# Patient Record
Sex: Male | Born: 1975 | Race: White | Hispanic: No | Marital: Married | Smoking: Former smoker
Health system: Southern US, Community
[De-identification: ages and names within clinical notes are randomized; demographics above are authoritative.]

## PROBLEM LIST (undated history)

## (undated) DIAGNOSIS — Q059 Spina bifida, unspecified: Secondary | ICD-10-CM

## (undated) DIAGNOSIS — N289 Disorder of kidney and ureter, unspecified: Secondary | ICD-10-CM

## (undated) DIAGNOSIS — J45909 Unspecified asthma, uncomplicated: Secondary | ICD-10-CM

## (undated) DIAGNOSIS — N319 Neuromuscular dysfunction of bladder, unspecified: Secondary | ICD-10-CM

## (undated) DIAGNOSIS — G919 Hydrocephalus, unspecified: Secondary | ICD-10-CM

## (undated) DIAGNOSIS — R569 Unspecified convulsions: Secondary | ICD-10-CM

## (undated) HISTORY — PX: CHOLECYSTECTOMY: SHX55

## (undated) HISTORY — PX: VENTRICULOPERITONEAL SHUNT: SHX204

---

## 2012-06-03 DIAGNOSIS — G609 Hereditary and idiopathic neuropathy, unspecified: Secondary | ICD-10-CM | POA: Insufficient documentation

## 2012-06-03 DIAGNOSIS — Q054 Unspecified spina bifida with hydrocephalus: Secondary | ICD-10-CM | POA: Insufficient documentation

## 2012-08-19 DIAGNOSIS — G43909 Migraine, unspecified, not intractable, without status migrainosus: Secondary | ICD-10-CM | POA: Insufficient documentation

## 2014-03-11 DIAGNOSIS — G8929 Other chronic pain: Secondary | ICD-10-CM | POA: Insufficient documentation

## 2014-10-07 DIAGNOSIS — F17200 Nicotine dependence, unspecified, uncomplicated: Secondary | ICD-10-CM | POA: Insufficient documentation

## 2014-10-18 DIAGNOSIS — R55 Syncope and collapse: Secondary | ICD-10-CM | POA: Insufficient documentation

## 2015-06-09 DIAGNOSIS — Z982 Presence of cerebrospinal fluid drainage device: Secondary | ICD-10-CM | POA: Insufficient documentation

## 2015-12-15 DIAGNOSIS — N3946 Mixed incontinence: Secondary | ICD-10-CM | POA: Insufficient documentation

## 2016-01-25 DIAGNOSIS — G4733 Obstructive sleep apnea (adult) (pediatric): Secondary | ICD-10-CM | POA: Insufficient documentation

## 2016-01-25 DIAGNOSIS — E669 Obesity, unspecified: Secondary | ICD-10-CM | POA: Insufficient documentation

## 2016-07-24 DIAGNOSIS — N200 Calculus of kidney: Secondary | ICD-10-CM | POA: Insufficient documentation

## 2016-10-22 DIAGNOSIS — K859 Acute pancreatitis without necrosis or infection, unspecified: Secondary | ICD-10-CM | POA: Insufficient documentation

## 2016-10-22 DIAGNOSIS — R112 Nausea with vomiting, unspecified: Secondary | ICD-10-CM | POA: Diagnosis present

## 2016-10-22 LAB — COMPREHENSIVE METABOLIC PANEL
ALT: 32 U/L (ref 17–63)
AST: 26 U/L (ref 15–41)
Albumin: 4.6 g/dL (ref 3.5–5.0)
Alkaline Phosphatase: 91 U/L (ref 38–126)
Anion gap: 7 (ref 5–15)
BUN: 19 mg/dL (ref 6–20)
CALCIUM: 9.2 mg/dL (ref 8.9–10.3)
CO2: 21 mmol/L — AB (ref 22–32)
Chloride: 109 mmol/L (ref 101–111)
Creatinine, Ser: 1.03 mg/dL (ref 0.61–1.24)
GFR calc Af Amer: >60 mL/min (ref >60)
GLUCOSE: 110 mg/dL — AB (ref 65–99)
Potassium: 3.5 mmol/L (ref 3.5–5.1)
Sodium: 137 mmol/L (ref 135–145)
Total Bilirubin: 0.5 mg/dL (ref 0.3–1.2)
Total Protein: 8.2 g/dL — ABNORMAL HIGH (ref 6.5–8.1)

## 2016-10-22 LAB — CBC
HCT: 47 % (ref 40.0–52.0)
HEMOGLOBIN: 16.5 g/dL (ref 13.0–18.0)
MCH: 28.9 pg (ref 26.0–34.0)
MCHC: 35.2 g/dL (ref 32.0–36.0)
MCV: 82 fL (ref 80.0–100.0)
Platelets: 261 10*3/uL (ref 150–440)
RBC: 5.72 MIL/uL (ref 4.40–5.90)
RDW: 14 % (ref 11.5–14.5)
WBC: 12.1 10*3/uL — ABNORMAL HIGH (ref 3.8–10.6)

## 2016-10-22 LAB — LIPASE, BLOOD: LIPASE: 52 U/L — AB (ref 11–51)

## 2016-10-22 NOTE — ED Triage Notes (Signed)
Patient reports symptoms began this morning.  Reports nausea, vomiting and diarrhea.

## 2016-10-23 ENCOUNTER — Encounter: Payer: Self-pay | Admitting: Emergency Medicine

## 2016-10-23 ENCOUNTER — Emergency Department
Admission: EM | Admit: 2016-10-23 | Discharge: 2016-10-23 | Disposition: A | Discharge status: Home / Self Care | Payer: Medicaid Other | Attending: Emergency Medicine | Admitting: Emergency Medicine

## 2016-10-23 DIAGNOSIS — R112 Nausea with vomiting, unspecified: Secondary | ICD-10-CM

## 2016-10-23 DIAGNOSIS — K859 Acute pancreatitis without necrosis or infection, unspecified: Secondary | ICD-10-CM

## 2016-10-23 DIAGNOSIS — R197 Diarrhea, unspecified: Secondary | ICD-10-CM

## 2016-10-23 HISTORY — DX: Hydrocephalus, unspecified: G91.9

## 2016-10-23 HISTORY — DX: Unspecified convulsions: R56.9

## 2016-10-23 HISTORY — DX: Disorder of kidney and ureter, unspecified: N28.9

## 2016-10-23 HISTORY — DX: Neuromuscular dysfunction of bladder, unspecified: N31.9

## 2016-10-23 HISTORY — DX: Spina bifida, unspecified: Q05.9

## 2016-10-23 LAB — URINALYSIS, COMPLETE (UACMP) WITH MICROSCOPIC
BILIRUBIN URINE: NEGATIVE
Bacteria, UA: NONE SEEN
Glucose, UA: NEGATIVE mg/dL
HGB URINE DIPSTICK: NEGATIVE
KETONES UR: NEGATIVE mg/dL
NITRITE: NEGATIVE
Protein, ur: NEGATIVE mg/dL
Specific Gravity, Urine: 1.027 (ref 1.005–1.030)
pH: 5 (ref 5.0–8.0)

## 2016-10-23 MED ORDER — ONDANSETRON 4 MG PO TBDP: 4.0000 mg | ORAL_TABLET | Freq: Three times a day (TID) | ORAL | 0 refills | Status: DC | PRN

## 2016-10-23 MED ORDER — SODIUM CHLORIDE 0.9 % IV BOLUS (SEPSIS)
1000.0000 mL | Freq: Once | INTRAVENOUS | Status: AC
  Administered 2016-10-23: 1000 mL via INTRAVENOUS

## 2016-10-23 NOTE — ED Notes (Signed)
Pt. States he started vomiting when he woke up yesterday.  Pt. States vomiting 3 more times in a.m.  Pt. States diarrhea started in afternoon with multiple episodes.

## 2016-10-23 NOTE — ED Provider Notes (Signed)
[facility] Emergency Department Provider Note   ____________________________________________   First MD Initiated Contact with Patient 10/23/16 0102     (approximate)  I have reviewed the triage vital signs and the nursing notes.   HISTORY  Chief Complaint Emesis and Diarrhea    HPI Timothy Mullins is a 41 y.o. male who comes into the hospital today and thinking he has the flu. The patient has been having vomiting and diarrhea. He has not had any fever and he has some squeezing pain in his abdomen. The patient's symptoms started this morning. He reports that he vomited 3 times and it was dark-looking and brown. He's had 8 episodes of diarrhea since. He has been drinking some water. He rates abdominal discomfort about a 2 out of 10 in intensity. The patient denies any sick contacts. He has no chest pain or shortness of breath or body aches. He's had some chills with some nausea and some headache. The patient is here today for evaluation.   Past Medical History:  Diagnosis Date  . Hydrocephalus   . Neurogenic bladder   . Renal disorder   . Seizures (HCC)   . Spina bifida (HCC)     There are no active problems to display for this patient.   Past Surgical History:  Procedure Laterality Date  . CHOLECYSTECTOMY    . VENTRICULOPERITONEAL SHUNT      Prior to Admission medications   Medication Sig Start Date End Date Taking? Authorizing Provider  ondansetron (ZOFRAN ODT) 4 MG disintegrating tablet Take 1 tablet (4 mg total) by mouth every 8 (eight) hours as needed for nausea or vomiting. 10/23/16   Rebecka Apley, MD    Allergies Ativan [lorazepam]; Benadryl [diphenhydramine hcl (sleep)]; Iodine; and Latex  No family history on file.  Social History Social History  Substance Use Topics  . Smoking status: Never Smoker  . Smokeless tobacco: Not on file  . Alcohol use No    Review of Systems Constitutional: No fever/chills Eyes: No visual  changes. ENT: No sore throat. Cardiovascular: Denies chest pain. Respiratory: Denies shortness of breath. Gastrointestinal: abdominal pain.   nausea,  vomiting. diarrhea.  No constipation. Genitourinary: Negative for dysuria. Musculoskeletal: Negative for back pain. Skin: Negative for rash. Neurological: headache  10-point ROS otherwise negative.  ____________________________________________   PHYSICAL EXAM:  VITAL SIGNS: ED Triage Vitals  Enc Vitals Group     BP 10/22/16 2143 (!) 130/91     Pulse Rate 10/22/16 2143 (!) 102     Resp 10/22/16 2143 18     Temp 10/22/16 2143 98.5 F (36.9 C)     Temp Source 10/22/16 2143 Oral     SpO2 10/22/16 2143 97 %     Weight 10/22/16 2142 182 lb (82.6 kg)     Height 10/22/16 2142 5\' 2"  (1.575 m)     Head Circumference --      Peak Flow --      Pain Score 10/22/16 2142 2     Pain Loc --      Pain Edu? --      Excl. in GC? --     Constitutional: Alert and oriented. Well appearing and in mild distress. Eyes: Conjunctivae are normal. PERRL. EOMI. Head: Atraumatic. Nose: No congestion/rhinnorhea. Mouth/Throat: Mucous membranes are moist.  Oropharynx non-erythematous. Cardiovascular: Normal rate, regular rhythm. Grossly normal heart sounds.  Good peripheral circulation. Respiratory: Normal respiratory effort.  No retractions. Lungs CTAB. Gastrointestinal: Soft with some epigastric tenderness to  palpation. No distention. Positive bowel sounds Musculoskeletal: No lower extremity tenderness nor edema.   Neurologic:  Normal speech and language.  Skin:  Skin is warm, dry and intact.  Psychiatric: Mood and affect are normal.   ____________________________________________   LABS (all labs ordered are listed, but only abnormal results are displayed)  Labs Reviewed  LIPASE, BLOOD - Abnormal; Notable for the following:       Result Value   Lipase 52 (*)    All other components within normal limits  COMPREHENSIVE METABOLIC PANEL -  Abnormal; Notable for the following:    CO2 21 (*)    Glucose, Bld 110 (*)    Total Protein 8.2 (*)    All other components within normal limits  CBC - Abnormal; Notable for the following:    WBC 12.1 (*)    All other components within normal limits  URINALYSIS, COMPLETE (UACMP) WITH MICROSCOPIC - Abnormal; Notable for the following:    Color, Urine YELLOW (*)    APPearance CLEAR (*)    Leukocytes, UA TRACE (*)    Squamous Epithelial / LPF 0-5 (*)    All other components within normal limits   ____________________________________________  EKG  none ____________________________________________  RADIOLOGY  none ____________________________________________   PROCEDURES  Procedure(s) performed: None  Procedures  Critical Care performed: No  ____________________________________________   INITIAL IMPRESSION / ASSESSMENT AND PLAN / ED COURSE  Pertinent labs & imaging results that were available during my care of the patient were reviewed by me and considered in my medical decision making (see chart for details).  This is a 41 year old male who comes into the hospital today with some nausea and vomiting and diarrhea. The patient does have some abdominal cramping but otherwise no other complaints. He thought he had flu but is not having any fevers body aches. The patient does appear to have some mild pancreatitis with elevated lipase. I did give him a liter of normal saline and he was feeling improved. He was able to drink and eat crackers without any vomiting. I will discharge the patient to home. He likely has some gastroenteritis with some acute pancreatitis. The patient should follow-up with his primary care physician.      ____________________________________________   FINAL CLINICAL IMPRESSION(S) / ED DIAGNOSES  Final diagnoses:  Acute pancreatitis, unspecified complication status, unspecified pancreatitis type  Nausea vomiting and diarrhea      NEW  MEDICATIONS STARTED DURING THIS VISIT:  New Prescriptions   ONDANSETRON (ZOFRAN ODT) 4 MG DISINTEGRATING TABLET    Take 1 tablet (4 mg total) by mouth every 8 (eight) hours as needed for nausea or vomiting.     Note:  This document was prepared using Dragon voice recognition software and may include unintentional dictation errors.    Rebecka ApleyAllison P Webster, MD 10/23/16 [phone]

## 2016-10-23 NOTE — ED Notes (Signed)
Pt. Going home with family. 

## 2017-02-27 ENCOUNTER — Encounter: Payer: Self-pay | Admitting: Emergency Medicine

## 2017-02-27 DIAGNOSIS — Z5321 Procedure and treatment not carried out due to patient leaving prior to being seen by health care provider: Secondary | ICD-10-CM | POA: Insufficient documentation

## 2017-02-27 DIAGNOSIS — M79601 Pain in right arm: Secondary | ICD-10-CM | POA: Diagnosis present

## 2017-02-27 NOTE — ED Triage Notes (Addendum)
Pt to triage via w/c with no distress noted, brought in by EMS; reports pain to right upper arm, sudden onset while watching TV hr PTA; st pain increases with arm raise; pt denies any accomp symptoms; denies any weakness or numbness

## 2017-02-28 ENCOUNTER — Emergency Department
Admission: EM | Admit: 2017-02-28 | Discharge: 2017-02-28 | Disposition: A | Discharge status: Home / Self Care | Payer: Medicaid Other | Attending: Emergency Medicine | Admitting: Emergency Medicine

## 2017-07-05 DIAGNOSIS — N10 Acute tubulo-interstitial nephritis: Secondary | ICD-10-CM | POA: Insufficient documentation

## 2017-11-03 ENCOUNTER — Emergency Department
Admission: EM | Admit: 2017-11-03 | Discharge: 2017-11-03 | Disposition: A | Discharge status: Home / Self Care | Payer: Non-veteran care | Attending: Emergency Medicine | Admitting: Emergency Medicine

## 2017-11-03 ENCOUNTER — Other Ambulatory Visit: Payer: Self-pay

## 2017-11-03 ENCOUNTER — Encounter: Payer: Self-pay | Admitting: Emergency Medicine

## 2017-11-03 ENCOUNTER — Emergency Department: Payer: Non-veteran care

## 2017-11-03 DIAGNOSIS — R0981 Nasal congestion: Secondary | ICD-10-CM | POA: Diagnosis present

## 2017-11-03 DIAGNOSIS — R319 Hematuria, unspecified: Secondary | ICD-10-CM | POA: Insufficient documentation

## 2017-11-03 DIAGNOSIS — J069 Acute upper respiratory infection, unspecified: Secondary | ICD-10-CM | POA: Insufficient documentation

## 2017-11-03 DIAGNOSIS — Z79899 Other long term (current) drug therapy: Secondary | ICD-10-CM | POA: Insufficient documentation

## 2017-11-03 DIAGNOSIS — Z91040 Latex allergy status: Secondary | ICD-10-CM | POA: Insufficient documentation

## 2017-11-03 LAB — URINALYSIS, COMPLETE (UACMP) WITH MICROSCOPIC
BACTERIA UA: NONE SEEN
Bilirubin Urine: NEGATIVE
GLUCOSE, UA: NEGATIVE mg/dL
KETONES UR: NEGATIVE mg/dL
Leukocytes, UA: NEGATIVE
NITRITE: NEGATIVE
PROTEIN: NEGATIVE mg/dL
Specific Gravity, Urine: 1.015 (ref 1.005–1.030)
pH: 7 (ref 5.0–8.0)

## 2017-11-03 LAB — INFLUENZA PANEL BY PCR (TYPE A & B)
Influenza A By PCR: NEGATIVE
Influenza B By PCR: NEGATIVE

## 2017-11-03 MED ORDER — FLUTICASONE PROPIONATE 50 MCG/ACT NA SUSP: 2.0000 | Freq: Every day | NASAL | 0 refills | Status: DC

## 2017-11-03 MED ORDER — PSEUDOEPH-BROMPHEN-DM 30-2-10 MG/5ML PO SYRP: 2.5000 mL | ORAL_SOLUTION | Freq: Four times a day (QID) | ORAL | 0 refills | Status: DC | PRN

## 2017-11-03 NOTE — ED Notes (Signed)
Pt c/o facial pain since this morning; mostly congestion and a H/A since yesterday.

## 2017-11-03 NOTE — ED Triage Notes (Signed)
Sinus congestion and drainage x 3 days. States drainage is green.

## 2017-11-03 NOTE — ED Provider Notes (Signed)
[facility] Emergency Department Provider Note  ____________________________________________  Time seen: Approximately 3:51 PM  I have reviewed the triage vital signs and the nursing notes.   HISTORY  Chief Complaint Facial Pain   HPI Timothy Mullins is a 42 y.o. male that presents to the emergency department for evaluation of nasal congestion and productive cough with yellow sputum for 2 days. He states that he has also had some central lower abdominal pain and has also had a couple of episodes of diarrhea since Wednesday.  Patient went to urgent care yesterday and was told that he has a viral sinus infection and was told to purchase OTC cough and cold medication.  He states that he does not believe this and thinks he has influenza.  Wife also has cold symptoms.   He has a neurogenic bladder and self catheterizes.  He has had several urinary tract infections in the past and is concerned that he may have another.  He has an appointment with his urologist in 2 weeks.  No fever, chills, back pain, flank pain, hematuria.  Past Medical History:  Diagnosis Date  . Hydrocephalus   . Neurogenic bladder   . Renal disorder   . Seizures (HCC)   . Spina bifida (HCC)     There are no active problems to display for this patient.   Past Surgical History:  Procedure Laterality Date  . CHOLECYSTECTOMY    . VENTRICULOPERITONEAL SHUNT      Prior to Admission medications   Medication Sig Start Date End Date Taking? Authorizing Provider  brompheniramine-pseudoephedrine-DM 30-2-10 MG/5ML syrup Take 2.5 mLs by mouth 4 (four) times daily as needed. 11/03/17   Enid Derry, PA-C  fluticasone (FLONASE) 50 MCG/ACT nasal spray Place 2 sprays into both nostrils daily. 11/03/17 11/03/18  Enid Derry, PA-C  ondansetron (ZOFRAN ODT) 4 MG disintegrating tablet Take 1 tablet (4 mg total) by mouth every 8 (eight) hours as needed for nausea or vomiting. 10/23/16   Rebecka Apley, MD     Allergies Ativan [lorazepam]; Benadryl [diphenhydramine hcl (sleep)]; Iodine; and Latex  No family history on file.  Social History Social History   Tobacco Use  . Smoking status: Never Smoker  . Smokeless tobacco: Never Used  Substance Use Topics  . Alcohol use: No  . Drug use: Not on file     Review of Systems  Constitutional: No fever/chills Eyes: No visual changes. No discharge. ENT: Positive for congestion and rhinorrhea. Cardiovascular: No chest pain. Respiratory: Positive for cough. No SOB. Gastrointestinal: No vomiting.  No constipation. Musculoskeletal: Negative for musculoskeletal pain. Skin: Negative for rash, abrasions, lacerations, ecchymosis. Neurological: Negative for headaches.   ____________________________________________   PHYSICAL EXAM:  VITAL SIGNS: ED Triage Vitals [11/03/17 1431]  Enc Vitals Group     BP 130/89     Pulse Rate 93     Resp 20     Temp 98.7 F (37.1 C)     Temp Source Oral     SpO2 97 %     Weight 174 lb (78.9 kg)     Height 5\' 2"  (1.575 m)     Head Circumference      Peak Flow      Pain Score 8     Pain Loc      Pain Edu?      Excl. in GC?      Constitutional: Alert and oriented. Well appearing and in no acute distress. Eyes: Conjunctivae are normal. PERRL. EOMI. No  discharge. Head: Atraumatic. ENT: Positive maxillary sinus tenderness.      Ears: Tympanic membranes pearly gray with good landmarks. No discharge.      Nose: Mild congestion/rhinnorhea.      Mouth/Throat: Mucous membranes are moist. Oropharynx non-erythematous. Tonsils not enlarged. No exudates. Uvula midline. Neck: No stridor.   Hematological/Lymphatic/Immunilogical: No cervical lymphadenopathy. Cardiovascular: Normal rate, regular rhythm.  Good peripheral circulation. Respiratory: Normal respiratory effort without tachypnea or retractions. Lungs CTAB. Good air entry to the bases with no decreased or absent breath sounds. Gastrointestinal:  Bowel sounds 4 quadrants.  Minimal suprapubic tenderness. No guarding or rigidity. No palpable masses. No distention. Musculoskeletal: Full range of motion to all extremities. No gross deformities appreciated. Neurologic:  Normal speech and language. No gross focal neurologic deficits are appreciated.  Skin:  Skin is warm, dry and intact. No rash noted.   ____________________________________________   LABS (all labs ordered are listed, but only abnormal results are displayed)  Labs Reviewed  URINALYSIS, COMPLETE (UACMP) WITH MICROSCOPIC - Abnormal; Notable for the following components:      Result Value   Color, Urine YELLOW (*)    APPearance CLOUDY (*)    Hgb urine dipstick SMALL (*)    Squamous Epithelial / LPF 0-5 (*)    All other components within normal limits  INFLUENZA PANEL BY PCR (TYPE A & B)   ____________________________________________  EKG   ____________________________________________  RADIOLOGY Lexine BatonI, Alisea Matte, personally viewed and evaluated these images (plain radiographs) as part of my medical decision making, as well as reviewing the written report by the radiologist.  Dg Chest 2 View  Result Date: 11/03/2017 CLINICAL DATA:  Cough and shortness of breath for 3 days. EXAM: CHEST  2 VIEW COMPARISON:  None. FINDINGS: The cardiomediastinal silhouette is unremarkable. VP shunt catheters overlying the chest noted. There is no evidence of focal airspace disease, pulmonary edema, suspicious pulmonary nodule/mass, pleural effusion, or pneumothorax. No acute bony abnormalities are identified. IMPRESSION: No active cardiopulmonary disease. Electronically Signed   By: Harmon PierJeffrey  Hu M.D.   On: 11/03/2017 16:35   Ct Renal Stone Study  Result Date: 11/03/2017 CLINICAL DATA:  Hematuria EXAM: CT ABDOMEN AND PELVIS WITHOUT CONTRAST TECHNIQUE: Multidetector CT imaging of the abdomen and pelvis was performed following the standard protocol without IV contrast. COMPARISON:  None.  FINDINGS: Lower chest: Lung bases demonstrate no acute consolidation or pleural effusion. Normal heart size. Hepatobiliary: No focal liver abnormality is seen. Status post cholecystectomy. No biliary dilatation. Pancreas: Unremarkable. No pancreatic ductal dilatation or surrounding inflammatory changes. Spleen: Normal in size without focal abnormality. Adrenals/Urinary Tract: Adrenal glands are within normal limits. No hydronephrosis. Stone in the midpole of the left kidney and the lower pole of the left kidney. Stone in the midpole measures up to 7 mm on coronal views. No ureteral stone. The bladder is unremarkable Stomach/Bowel: Stomach is within normal limits. Appendix appears normal. No evidence of bowel wall thickening, distention, or inflammatory changes. Vascular/Lymphatic: No significant vascular findings are present. No enlarged abdominal or pelvic lymph nodes. Reproductive: Coarse calcifications in the prostate. Other: Negative for free air or free fluid. Shunt catheters within the anterior abdomen, no associated fluid collections. Small fat in the umbilical region. Musculoskeletal: No acute or significant osseous findings. IMPRESSION: 1. Nonobstructing stones within the left kidney. No evidence for ureteral stone. 2. No CT evidence for acute intra-abdominal or pelvic abnormality. Electronically Signed   By: Jasmine PangKim  Fujinaga M.D.   On: 11/03/2017 18:42    ____________________________________________  PROCEDURES  Procedure(s) performed:    Procedures    Medications - No data to display   ____________________________________________   INITIAL IMPRESSION / ASSESSMENT AND PLAN / ED COURSE  Pertinent labs & imaging results that were available during my care of the patient were reviewed by me and considered in my medical decision making (see chart for details).  Review of the [place] CSRS was performed in accordance of the [facility] prior to dispensing any controlled drugs.   Patient presented  to the emergency department for evaluation of URI symptoms for 2 days.  Symptoms and exam are consistent with a viral illness.  Vital signs and exam are reassuring.  Influenza test is negative.  Chest x-ray negative for acute abnormalities.  Patient would also like to be checked for a UTI for some mild suprapubic tenderness and a history of UTIs.  Urinalysis shows red blood cells, which is consistent with previous urinalysis results.  No signs of infection.  CT renal stone study shows left nonobstructing kidney stones.  He has an appointment with his urologist in 2 weeks.  He was encouraged to move this appointment sooner.  He is in the room eating Fritos and drinking [address].  Patient feels comfortable going home. Patient will be discharged home with prescriptions for Flonase and Bromfed. Patient is to follow up with PCP as needed or otherwise directed. Patient is given ED precautions to return to the ED for any worsening or new symptoms.     ____________________________________________  FINAL CLINICAL IMPRESSION(S) / ED DIAGNOSES  Final diagnoses:  Upper respiratory tract infection, unspecified type      NEW MEDICATIONS STARTED DURING THIS VISIT:  ED Discharge Orders        Ordered    fluticasone (FLONASE) 50 MCG/ACT nasal spray  Daily     11/03/17 1851    brompheniramine-pseudoephedrine-DM 30-2-10 MG/5ML syrup  4 times daily PRN     11/03/17 1851          This chart was dictated using voice recognition software/Dragon. Despite best efforts to proofread, errors can occur which can change the meaning. Any change was purely unintentional.    Enid Derry, PA-C 11/03/17 Mancel Parsons, MD 11/04/17 [phone]

## 2017-12-27 ENCOUNTER — Emergency Department
Admission: EM | Admit: 2017-12-27 | Discharge: 2017-12-27 | Disposition: A | Discharge status: Home / Self Care | Payer: Non-veteran care | Attending: Emergency Medicine | Admitting: Emergency Medicine

## 2017-12-27 DIAGNOSIS — Y939 Activity, unspecified: Secondary | ICD-10-CM | POA: Insufficient documentation

## 2017-12-27 DIAGNOSIS — Y929 Unspecified place or not applicable: Secondary | ICD-10-CM | POA: Diagnosis not present

## 2017-12-27 DIAGNOSIS — Y999 Unspecified external cause status: Secondary | ICD-10-CM | POA: Diagnosis not present

## 2017-12-27 DIAGNOSIS — S61214A Laceration without foreign body of right ring finger without damage to nail, initial encounter: Secondary | ICD-10-CM | POA: Diagnosis not present

## 2017-12-27 DIAGNOSIS — Z23 Encounter for immunization: Secondary | ICD-10-CM | POA: Diagnosis not present

## 2017-12-27 DIAGNOSIS — W269XXA Contact with unspecified sharp object(s), initial encounter: Secondary | ICD-10-CM | POA: Diagnosis not present

## 2017-12-27 DIAGNOSIS — S6991XA Unspecified injury of right wrist, hand and finger(s), initial encounter: Secondary | ICD-10-CM | POA: Diagnosis present

## 2017-12-27 MED ORDER — TETANUS-DIPHTH-ACELL PERTUSSIS 5-2.5-18.5 LF-MCG/0.5 IM SUSP
0.5000 mL | Freq: Once | INTRAMUSCULAR | Status: AC
  Administered 2017-12-27: 0.5 mL via INTRAMUSCULAR
  Filled 2017-12-27: qty 0.5

## 2017-12-27 MED ORDER — BACITRACIN ZINC 500 UNIT/GM EX OINT
TOPICAL_OINTMENT | Freq: Once | CUTANEOUS | Status: AC
  Administered 2017-12-27: 1 via TOPICAL
  Filled 2017-12-27: qty 0.9

## 2017-12-27 MED ORDER — LIDOCAINE HCL (PF) 1 % IJ SOLN
5.0000 mL | Freq: Once | INTRAMUSCULAR | Status: AC
  Administered 2017-12-27: 5 mL
  Filled 2017-12-27: qty 5

## 2017-12-27 NOTE — ED Provider Notes (Signed)
[facility] Emergency Department Provider Note  ____________________________________________   First MD Initiated Contact with Patient 12/27/17 2028     (approximate)  I have reviewed the triage vital signs and the nursing notes.   HISTORY  Chief Complaint Laceration    HPI Timothy Mullins is a 42 y.o. male to the emergency department with laceration to the right fourth and fifth digits.  He states he was helping a friend with scrap metal and it cut his hand.  He is unsure of his last tetanus shot.  He has no loss of motion.   Past Medical History:  Diagnosis Date  . Hydrocephalus   . Neurogenic bladder   . Renal disorder   . Seizures (HCC)   . Spina bifida (HCC)     There are no active problems to display for this patient.   Past Surgical History:  Procedure Laterality Date  . CHOLECYSTECTOMY    . VENTRICULOPERITONEAL SHUNT      Prior to Admission medications   Medication Sig Start Date End Date Taking? Authorizing Provider  brompheniramine-pseudoephedrine-DM 30-2-10 MG/5ML syrup Take 2.5 mLs by mouth 4 (four) times daily as needed. 11/03/17   Enid Derry, PA-C  fluticasone (FLONASE) 50 MCG/ACT nasal spray Place 2 sprays into both nostrils daily. 11/03/17 11/03/18  Enid Derry, PA-C  ondansetron (ZOFRAN ODT) 4 MG disintegrating tablet Take 1 tablet (4 mg total) by mouth every 8 (eight) hours as needed for nausea or vomiting. 10/23/16   Rebecka Apley, MD    Allergies Ativan [lorazepam]; Benadryl [diphenhydramine hcl (sleep)]; Iodine; and Latex  No family history on file.  Social History Social History   Tobacco Use  . Smoking status: Never Smoker  . Smokeless tobacco: Never Used  Substance Use Topics  . Alcohol use: No  . Drug use: Not on file    Review of Systems  Constitutional: No fever/chills Eyes: No visual changes. ENT: No sore throat. Respiratory: Denies cough Genitourinary: Negative for dysuria. Musculoskeletal:  Negative for back pain.  Several laceration to the right fourth and fifth fingers Skin: Negative for rash.  Most of lacerations    ____________________________________________   PHYSICAL EXAM:  VITAL SIGNS: ED Triage Vitals  Enc Vitals Group     BP 12/27/17 1920 125/86     Pulse Rate 12/27/17 1920 (!) 111     Resp 12/27/17 1920 16     Temp 12/27/17 1920 98.2 F (36.8 C)     Temp Source 12/27/17 1920 Oral     SpO2 12/27/17 1920 95 %     Weight 12/27/17 1921 177 lb (80.3 kg)     Height 12/27/17 1921 5\' 2"  (1.575 m)     Head Circumference --      Peak Flow --      Pain Score 12/27/17 1944 7     Pain Loc --      Pain Edu? --      Excl. in GC? --     Constitutional: Alert and oriented. Well appearing and in no acute distress. Eyes: Conjunctivae are normal.  Head: Atraumatic. Nose: No congestion/rhinnorhea. Mouth/Throat: Mucous membranes are moist.   Cardiovascular: Normal rate, regular rhythm. Respiratory: Normal respiratory effort.  No retractions GU: deferred Musculoskeletal: FROM all extremities, warm and well perfused.  Patient has full range of motion of all fingers.  There is a 2 cm laceration across the right fourth finger at the MIP.  No foreign bodies noted.  The right fifth finger has an  abrasion that is no longer bleeding.  Is neurovascularly intact. Neurologic:  Normal speech and language.  Skin:  Skin is warm, dry , possible laceration to the right fourth and fifth fingers no rash noted. Psychiatric: Mood and affect are normal. Speech and behavior are normal.  ____________________________________________   LABS (all labs ordered are listed, but only abnormal results are displayed)  Labs Reviewed - No data to display ____________________________________________   ____________________________________________  RADIOLOGY    ____________________________________________   PROCEDURES  Procedure(s) performed:   Marland Kitchen.Marland Kitchen.Laceration Repair Date/Time: 12/27/2017  11:16 PM Performed by: Faythe GheeFisher, Carisma Troupe W, PA-C Authorized by: Faythe GheeFisher, Kadijah Shamoon W, PA-C   Consent:    Consent obtained:  Verbal   Consent given by:  Patient   Risks discussed:  Infection, pain, retained foreign body, tendon damage, poor wound healing and poor cosmetic result   Alternatives discussed:  No treatment Anesthesia (see MAR for exact dosages):    Anesthesia method:  Local infiltration   Local anesthetic:  Lidocaine 1% w/o epi Laceration details:    Location:  Finger   Finger location:  R ring finger   Length (cm):  2   Depth (mm):  2 Repair type:    Repair type:  Simple Pre-procedure details:    Preparation:  Patient was prepped and draped in usual sterile fashion Exploration:    Hemostasis achieved with:  Direct pressure   Wound exploration: wound explored through full range of motion     Wound extent: no foreign bodies/material noted, no muscle damage noted, no tendon damage noted and no underlying fracture noted     Contaminated: no   Treatment:    Area cleansed with:  Betadine and saline   Amount of cleaning:  Standard   Irrigation solution:  Sterile saline   Irrigation method:  Syringe and tap   Visualized foreign bodies/material removed: no   Skin repair:    Repair method:  Sutures   Suture size:  5-0   Suture material:  Nylon   Number of sutures:  5 Approximation:    Approximation:  Close Post-procedure details:    Dressing:  Antibiotic ointment and non-adherent dressing   Patient tolerance of procedure:  Tolerated well, no immediate complications      ____________________________________________   INITIAL IMPRESSION / ASSESSMENT AND PLAN / ED COURSE  Pertinent labs & imaging results that were available during my care of the patient were reviewed by me and considered in my medical decision making (see chart for details).  Patient is a 42 year old male presents emergency department complaining of lacerations to the right fourth and fifth fingers.  On  physical exam there is a 2 cm laceration to the right fourth finger.  The laceration on the right fifth finger is very superficial and does not need repair.  No foreign bodies are noted.  Tendon functions are intact.  Procedure as above.  Patient tolerated procedure well.  He was given a Tdap while in the ED.  She was given discharge instructions discussing the care of sutures.  He have them removed in 7 to 10 days.  He states he understands will comply with our instructions.  He was discharged in stable condition     As part of my medical decision making, I reviewed the following data within the electronic MEDICAL RECORD NUMBER Nursing notes reviewed and incorporated, Notes from prior ED visits and  Controlled Substance Database  ____________________________________________   FINAL CLINICAL IMPRESSION(S) / ED DIAGNOSES  Final diagnoses:  Laceration of right  ring finger without foreign body without damage to nail, initial encounter      NEW MEDICATIONS STARTED DURING THIS VISIT:  Discharge Medication List as of 12/27/2017  9:09 PM       Note:  This document was prepared using Dragon voice recognition software and may include unintentional dictation errors.    Faythe Ghee, PA-C 12/27/17 2318    Minna Antis, MD 12/27/17 2351

## 2017-12-27 NOTE — Discharge Instructions (Addendum)
Follow-up with your regular doctor for suture removal in 7 days.  If you see any signs of redness pus or swelling please either see your doctor or return the emergency department for evaluation.  You may wash your hands and bathe.  Be certain to keep the areas clean and dry as possible.  Sutures are to be taken out in 7 days

## 2017-12-27 NOTE — ED Triage Notes (Signed)
Patient c/o lacerations to 4th and 5th digits right hand. Patient cut his hand on scrap metal.

## 2018-02-01 ENCOUNTER — Emergency Department
Admission: EM | Admit: 2018-02-01 | Discharge: 2018-02-01 | Disposition: A | Discharge status: Home / Self Care | Payer: Non-veteran care | Attending: Emergency Medicine | Admitting: Emergency Medicine

## 2018-02-01 ENCOUNTER — Other Ambulatory Visit: Payer: Self-pay

## 2018-02-01 ENCOUNTER — Emergency Department: Payer: Non-veteran care

## 2018-02-01 ENCOUNTER — Encounter: Payer: Self-pay | Admitting: Emergency Medicine

## 2018-02-01 DIAGNOSIS — S46912A Strain of unspecified muscle, fascia and tendon at shoulder and upper arm level, left arm, initial encounter: Secondary | ICD-10-CM | POA: Diagnosis not present

## 2018-02-01 DIAGNOSIS — R296 Repeated falls: Secondary | ICD-10-CM | POA: Diagnosis not present

## 2018-02-01 DIAGNOSIS — Y929 Unspecified place or not applicable: Secondary | ICD-10-CM | POA: Insufficient documentation

## 2018-02-01 DIAGNOSIS — Y999 Unspecified external cause status: Secondary | ICD-10-CM | POA: Diagnosis not present

## 2018-02-01 DIAGNOSIS — S4992XA Unspecified injury of left shoulder and upper arm, initial encounter: Secondary | ICD-10-CM | POA: Diagnosis present

## 2018-02-01 DIAGNOSIS — Y939 Activity, unspecified: Secondary | ICD-10-CM | POA: Diagnosis not present

## 2018-02-01 DIAGNOSIS — Z79899 Other long term (current) drug therapy: Secondary | ICD-10-CM | POA: Insufficient documentation

## 2018-02-01 DIAGNOSIS — Z91040 Latex allergy status: Secondary | ICD-10-CM | POA: Diagnosis not present

## 2018-02-01 DIAGNOSIS — W19XXXA Unspecified fall, initial encounter: Secondary | ICD-10-CM

## 2018-02-01 DIAGNOSIS — W010XXA Fall on same level from slipping, tripping and stumbling without subsequent striking against object, initial encounter: Secondary | ICD-10-CM | POA: Diagnosis not present

## 2018-02-01 DIAGNOSIS — Q059 Spina bifida, unspecified: Secondary | ICD-10-CM | POA: Diagnosis not present

## 2018-02-01 DIAGNOSIS — Y92009 Unspecified place in unspecified non-institutional (private) residence as the place of occurrence of the external cause: Secondary | ICD-10-CM

## 2018-02-01 MED ORDER — BACLOFEN 10 MG PO TABS: 10.0000 mg | ORAL_TABLET | Freq: Every day | ORAL | 1 refills | Status: AC

## 2018-02-01 MED ORDER — MELOXICAM 15 MG PO TABS: 15.0000 mg | ORAL_TABLET | Freq: Every day | ORAL | 2 refills | Status: AC

## 2018-02-01 NOTE — ED Triage Notes (Signed)
Pt reports frequent falls at baseline r/t bad balance from spina bifida history.  Here today for pain to left shoulder.  Left shoulder sits lower than right but unsure if from how pt is sitting.  Pt reports pain from shoulder to wrist.  Reports unable to move r/t pain.

## 2018-02-01 NOTE — ED Provider Notes (Signed)
[facility] Emergency Department Provider Note  ____________________________________________   First MD Initiated Contact with Patient 02/01/18 1242     (approximate)  I have reviewed the triage vital signs and the nursing notes.   HISTORY  Chief Complaint Fall    HPI Kala Gassmann is a 42 y.o. male who presents emergency department complaining of left shoulder pain.  He states that he has a history of spina bifida and he falls all the time.  He states he fell several times this past week.  He thinks he landed on the shoulder and outlooks on even.  He denies any numbness or tingling in the left hand or arm.  Past Medical History:  Diagnosis Date  . Hydrocephalus   . Neurogenic bladder   . Renal disorder   . Seizures (HCC)   . Spina bifida (HCC)     There are no active problems to display for this patient.   Past Surgical History:  Procedure Laterality Date  . CHOLECYSTECTOMY    . VENTRICULOPERITONEAL SHUNT      Prior to Admission medications   Medication Sig Start Date End Date Taking? Authorizing Provider  baclofen (LIORESAL) 10 MG tablet Take 1 tablet (10 mg total) by mouth daily. 02/01/18 02/01/19  Deseray Daponte, Roselyn Bering, PA-C  brompheniramine-pseudoephedrine-DM 30-2-10 MG/5ML syrup Take 2.5 mLs by mouth 4 (four) times daily as needed. 11/03/17   Enid Derry, PA-C  fluticasone (FLONASE) 50 MCG/ACT nasal spray Place 2 sprays into both nostrils daily. 11/03/17 11/03/18  Enid Derry, PA-C  meloxicam (MOBIC) 15 MG tablet Take 1 tablet (15 mg total) by mouth daily. 02/01/18 02/01/19  Sherrie Mustache, Roselyn Bering, PA-C  ondansetron (ZOFRAN ODT) 4 MG disintegrating tablet Take 1 tablet (4 mg total) by mouth every 8 (eight) hours as needed for nausea or vomiting. 10/23/16   Rebecka Apley, MD    Allergies Ativan [lorazepam]; Benadryl [diphenhydramine hcl (sleep)]; Iodine; and Latex  History reviewed. No pertinent family history.  Social History Social History    Tobacco Use  . Smoking status: Never Smoker  . Smokeless tobacco: Never Used  Substance Use Topics  . Alcohol use: No  . Drug use: Not on file    Review of Systems  Constitutional: No fever/chills Eyes: No visual changes. ENT: No sore throat. Respiratory: Denies cough Genitourinary: Negative for dysuria. Musculoskeletal: Negative for back pain.  Positive for left shoulder pain Skin: Negative for rash.    ____________________________________________   PHYSICAL EXAM:  VITAL SIGNS: ED Triage Vitals  Enc Vitals Group     BP 02/01/18 1245 121/71     Pulse Rate 02/01/18 1245 (!) 102     Resp 02/01/18 1245 20     Temp 02/01/18 1245 98.1 F (36.7 C)     Temp Source 02/01/18 1245 Oral     SpO2 02/01/18 1245 100 %     Weight 02/01/18 1239 168 lb (76.2 kg)     Height 02/01/18 1239  (1.575 m)     Head Circumference --      Peak Flow --      Pain Score 02/01/18 1239 10     Pain Loc --      Pain Edu? --      Excl. in GC? --     Constitutional: Alert and oriented. Well appearing and in no acute distress. Eyes: Conjunctivae are normal.  Head: Atraumatic. Nose: No congestion/rhinnorhea. Mouth/Throat: Mucous membranes are moist.   Cardiovascular: Normal rate, regular rhythm. Respiratory: Normal  respiratory effort.  No retractions GU: deferred Musculoskeletal: FROM all extremities, warm and well perfused.  The left shoulder is minimally tender across the clavicle and AC joint.  He is neurovascularly intact Neurologic:  Normal speech and language.  Skin:  Skin is warm, dry and intact. No rash noted. Psychiatric: Mood and affect are normal. Speech and behavior are normal.  ____________________________________________   LABS (all labs ordered are listed, but only abnormal results are displayed)  Labs Reviewed - No data to display ____________________________________________   ____________________________________________  RADIOLOGY  The left shoulder is  negative  ____________________________________________   PROCEDURES  Procedure(s) performed: Sling was applied by the tech  Procedures    ____________________________________________   INITIAL IMPRESSION / ASSESSMENT AND PLAN / ED COURSE  Pertinent labs & imaging results that were available during my care of the patient were reviewed by me and considered in my medical decision making (see chart for details).  Patient is 42 year old male presents emergency department complaining of left shoulder pain after a fall.  Physical exam patient appears well.  Left shoulder is tender to palpation.  X-ray of the left shoulder is negative for any acute abnormality.  Plan findings to the patient.  He was given a sling by the tech.  He is to apply ice.  He was given a prescription for Mobic 15 mg and baclofen 10 mg.  He states he understands all of the instructions.  He understands how to use medication.  He was discharged in stable condition     As part of my medical decision making, I reviewed the following data within the electronic MEDICAL RECORD NUMBER Nursing notes reviewed and incorporated, Old chart reviewed, Radiograph reviewed x-ray of the left shoulder is negative for any acute abnormality, Notes from prior ED visits and [place] Controlled Substance Database  ____________________________________________   FINAL CLINICAL IMPRESSION(S) / ED DIAGNOSES  Final diagnoses:  Fall in home, initial encounter  Shoulder strain, left, initial encounter      NEW MEDICATIONS STARTED DURING THIS VISIT:  Discharge Medication List as of 02/01/2018  2:11 PM    START taking these medications   Details  baclofen (LIORESAL) 10 MG tablet Take 1 tablet (10 mg total) by mouth daily., Starting Fri 02/01/2018, Until Sat 02/01/2019, Print    meloxicam (MOBIC) 15 MG tablet Take 1 tablet (15 mg total) by mouth daily., Starting Fri 02/01/2018, Until Sat 02/01/2019, Print         Note:  This document was  prepared using Dragon voice recognition software and may include unintentional dictation errors.    Faythe Ghee, PA-C 02/01/18 1556    Emily Filbert, MD 02/02/18 [phone]

## 2018-02-01 NOTE — Discharge Instructions (Addendum)
Follow-up with your regular doctor if not better in 3 to 5 days.  Wear the sling for comfort as needed.  Apply ice.  Take medications as prescribed.

## 2019-04-14 DIAGNOSIS — A419 Sepsis, unspecified organism: Secondary | ICD-10-CM | POA: Insufficient documentation

## 2019-04-15 DIAGNOSIS — R7881 Bacteremia: Secondary | ICD-10-CM | POA: Insufficient documentation

## 2019-04-15 DIAGNOSIS — N319 Neuromuscular dysfunction of bladder, unspecified: Secondary | ICD-10-CM | POA: Insufficient documentation

## 2019-04-15 DIAGNOSIS — R339 Retention of urine, unspecified: Secondary | ICD-10-CM | POA: Insufficient documentation

## 2019-04-15 DIAGNOSIS — A419 Sepsis, unspecified organism: Secondary | ICD-10-CM | POA: Insufficient documentation

## 2019-04-15 DIAGNOSIS — G40909 Epilepsy, unspecified, not intractable, without status epilepticus: Secondary | ICD-10-CM | POA: Insufficient documentation

## 2019-05-16 ENCOUNTER — Encounter: Payer: Self-pay | Admitting: Emergency Medicine

## 2019-05-16 ENCOUNTER — Other Ambulatory Visit: Payer: Self-pay

## 2019-05-16 ENCOUNTER — Emergency Department: Payer: Non-veteran care

## 2019-05-16 ENCOUNTER — Emergency Department
Admission: EM | Admit: 2019-05-16 | Discharge: 2019-05-16 | Disposition: A | Discharge status: Home / Self Care | Payer: Non-veteran care | Attending: Emergency Medicine | Admitting: Emergency Medicine

## 2019-05-16 DIAGNOSIS — Z91040 Latex allergy status: Secondary | ICD-10-CM | POA: Insufficient documentation

## 2019-05-16 DIAGNOSIS — N202 Calculus of kidney with calculus of ureter: Secondary | ICD-10-CM | POA: Insufficient documentation

## 2019-05-16 DIAGNOSIS — R1032 Left lower quadrant pain: Secondary | ICD-10-CM | POA: Diagnosis present

## 2019-05-16 DIAGNOSIS — N200 Calculus of kidney: Secondary | ICD-10-CM

## 2019-05-16 LAB — COMPREHENSIVE METABOLIC PANEL
ALT: 22 U/L (ref 0–44)
AST: 34 U/L (ref 15–41)
Albumin: 4.5 g/dL (ref 3.5–5.0)
Alkaline Phosphatase: 77 U/L (ref 38–126)
Anion gap: 15 (ref 5–15)
BUN: 14 mg/dL (ref 6–20)
CO2: 16 mmol/L — ABNORMAL LOW (ref 22–32)
Calcium: 9.7 mg/dL (ref 8.9–10.3)
Chloride: 107 mmol/L (ref 98–111)
Creatinine, Ser: 1.44 mg/dL — ABNORMAL HIGH (ref 0.61–1.24)
GFR calc Af Amer: >60 mL/min (ref >60)
GFR calc non Af Amer: 58 mL/min — ABNORMAL LOW (ref >60)
Glucose, Bld: 186 mg/dL — ABNORMAL HIGH (ref 70–99)
Potassium: 4 mmol/L (ref 3.5–5.1)
Sodium: 138 mmol/L (ref 135–145)
Total Bilirubin: 0.6 mg/dL (ref 0.3–1.2)
Total Protein: 7.9 g/dL (ref 6.5–8.1)

## 2019-05-16 LAB — URINALYSIS, COMPLETE (UACMP) WITH MICROSCOPIC
Bacteria, UA: NONE SEEN
Bilirubin Urine: NEGATIVE
Glucose, UA: NEGATIVE mg/dL
Ketones, ur: NEGATIVE mg/dL
Nitrite: NEGATIVE
Protein, ur: 100 mg/dL — AB
RBC / HPF: >50 RBC/hpf — ABNORMAL HIGH (ref 0–5)
Specific Gravity, Urine: 1.03 (ref 1.005–1.030)
WBC, UA: >50 WBC/hpf — ABNORMAL HIGH (ref 0–5)
pH: 5 (ref 5.0–8.0)

## 2019-05-16 LAB — CBC
HCT: 43.8 % (ref 39.0–52.0)
Hemoglobin: 14.7 g/dL (ref 13.0–17.0)
MCH: 28.4 pg (ref 26.0–34.0)
MCHC: 33.6 g/dL (ref 30.0–36.0)
MCV: 84.7 fL (ref 80.0–100.0)
Platelets: 292 10*3/uL (ref 150–400)
RBC: 5.17 MIL/uL (ref 4.22–5.81)
RDW: 13.3 % (ref 11.5–15.5)
WBC: 12.6 10*3/uL — ABNORMAL HIGH (ref 4.0–10.5)
nRBC: 0 % (ref 0.0–0.2)

## 2019-05-16 LAB — LIPASE, BLOOD: Lipase: 38 U/L (ref 11–51)

## 2019-05-16 MED ORDER — ONDANSETRON 4 MG PO TBDP: 4.0000 mg | ORAL_TABLET | Freq: Three times a day (TID) | ORAL | 0 refills | Status: DC | PRN

## 2019-05-16 MED ORDER — KETOROLAC TROMETHAMINE 30 MG/ML IJ SOLN
30.0000 mg | Freq: Once | INTRAMUSCULAR | Status: AC
  Administered 2019-05-16: 30 mg via INTRAMUSCULAR
  Filled 2019-05-16: qty 1

## 2019-05-16 MED ORDER — CEPHALEXIN 500 MG PO CAPS
500.0000 mg | ORAL_CAPSULE | Freq: Once | ORAL | Status: AC
  Administered 2019-05-16: 500 mg via ORAL
  Filled 2019-05-16: qty 1

## 2019-05-16 MED ORDER — NAPROXEN 500 MG PO TABS: 500.0000 mg | ORAL_TABLET | Freq: Two times a day (BID) | ORAL | 2 refills | Status: DC

## 2019-05-16 MED ORDER — HYDROCODONE-ACETAMINOPHEN 5-325 MG PO TABS: 1.0000 | ORAL_TABLET | ORAL | 0 refills | Status: DC | PRN

## 2019-05-16 MED ORDER — TAMSULOSIN HCL 0.4 MG PO CAPS: 0.4000 mg | ORAL_CAPSULE | Freq: Every day | ORAL | 0 refills | Status: AC

## 2019-05-16 MED ORDER — SODIUM CHLORIDE 0.9% FLUSH: 3.0000 mL | Freq: Once | INTRAVENOUS | Status: DC

## 2019-05-16 MED ORDER — CEPHALEXIN 500 MG PO CAPS: 500.0000 mg | ORAL_CAPSULE | Freq: Three times a day (TID) | ORAL | 0 refills | Status: AC

## 2019-05-16 NOTE — ED Triage Notes (Signed)
Pt to ER with c/o left abdominal pain.  Pt states pain for last 2-3 days, last BM 2 days ago.  States n/v, hx of "impacted bowels".  Pt states pain increases when he lays down.

## 2019-05-16 NOTE — ED Notes (Signed)
Patient with complaint of left side abdominal pain times two days. Patient states that the pain has become worse. Patient states that he has vomited times one. Patient denies pain with urination.

## 2019-05-16 NOTE — ED Provider Notes (Signed)
[facility] Emergency Department Provider Note   ____________________________________________    I have reviewed the triage vital signs and the nursing notes.   HISTORY  Chief Complaint Abdominal Pain     HPI Timothy Mullins is a 43 y.o. male who presents with complaints of left-sided abdominal pain.  Patient reports this pain is been ongoing for the last 2 days.  He describes as moderate to severe primarily in the left lower quadrant.  He reports constipation and thinks this may have some to do with it.  No fevers or chills.  No nausea or vomiting, decreased appetite.  Is not take anything for this.  Is never had this before.  No history of diverticulitis.  Past Medical History:  Diagnosis Date  . Hydrocephalus ([place])   . Neurogenic bladder   . Renal disorder   . Seizures ([place])   . Spina bifida ([place])     There are no active problems to display for this patient.   Past Surgical History:  Procedure Laterality Date  . CHOLECYSTECTOMY    . VENTRICULOPERITONEAL SHUNT      Prior to Admission medications   Medication Sig Start Date End Date Taking? Authorizing Provider  brompheniramine-pseudoephedrine-DM 30-2-10 MG/5ML syrup Take 2.5 mLs by mouth 4 (four) times daily as needed. 11/03/17   Laban Emperor, PA-C  cephALEXin (KEFLEX) 500 MG capsule Take 1 capsule (500 mg total) by mouth 3 (three) times daily for 7 days. 05/16/19 05/23/19  Lavonia Drafts, MD  fluticasone (FLONASE) 50 MCG/ACT nasal spray Place 2 sprays into both nostrils daily. 11/03/17 11/03/18  Laban Emperor, PA-C  HYDROcodone-acetaminophen (NORCO/VICODIN) 5-325 MG tablet Take 1 tablet by mouth every 4 (four) hours as needed for moderate pain. 05/16/19   Lavonia Drafts, MD  naproxen (NAPROSYN) 500 MG tablet Take 1 tablet (500 mg total) by mouth 2 (two) times daily with a meal. 05/16/19   Lavonia Drafts, MD  ondansetron (ZOFRAN ODT) 4 MG disintegrating tablet Take 1 tablet (4 mg total) by mouth every 8  (eight) hours as needed. 05/16/19   Lavonia Drafts, MD  tamsulosin (FLOMAX) 0.4 MG CAPS capsule Take 1 capsule (0.4 mg total) by mouth daily. 05/16/19   Lavonia Drafts, MD     Allergies Ativan [lorazepam], Benadryl [diphenhydramine hcl (sleep)], Iodine, and Latex  No family history on file.  Social History Social History   Tobacco Use  . Smoking status: Never Smoker  . Smokeless tobacco: Never Used  Substance Use Topics  . Alcohol use: No  . Drug use: Not on file    Review of Systems  Constitutional: No fever/chills Eyes: No visual changes.  ENT: No sore throat. Cardiovascular: Denies chest pain. Respiratory: Denies shortness of breath. Gastrointestinal: As above Genitourinary: History of neurogenic bladder, self caths Musculoskeletal: Negative for back pain. Skin: Negative for rash. Neurological: Negative for headaches   ____________________________________________   PHYSICAL EXAM:  VITAL SIGNS: ED Triage Vitals  Enc Vitals Group     BP 05/16/19 1702 (!) 129/97     Pulse Rate 05/16/19 1702 (!) 106     Resp 05/16/19 1702 18     Temp 05/16/19 1702 98.3 F (36.8 C)     Temp Source 05/16/19 1702 Oral     SpO2 05/16/19 1702 97 %     Weight 05/16/19 1703 76 kg (167 lb 8 oz)     Height 05/16/19 1703 1.575 m (5\' 2" )     Head Circumference --      Peak  Flow --      Pain Score 05/16/19 1702 9     Pain Loc --      Pain Edu? --      Excl. in GC? --     Constitutional: Alert and oriented.  Eyes: Conjunctivae are normal.    Mouth/Throat: Mucous membranes are moist.    Cardiovascular: Normal rate, regular rhythm. Grossly normal heart sounds.  Good peripheral circulation. Respiratory: Normal respiratory effort.  No retractions. Lungs CTAB. Gastrointestinal: Left lower quadrant tenderness to palpation. No distention.  No CVA tenderness. Musculoskeletal:.  Warm and well perfused Neurologic:  Normal speech and language. No gross focal neurologic deficits are  appreciated.  Skin:  Skin is warm, dry and intact. No rash noted. Psychiatric: Mood and affect are normal. Speech and behavior are normal.  ____________________________________________   LABS (all labs ordered are listed, but only abnormal results are displayed)  Labs Reviewed  COMPREHENSIVE METABOLIC PANEL - Abnormal; Notable for the following components:      Result Value   CO2 16 (*)    Glucose, Bld 186 (*)    Creatinine, Ser 1.44 (*)    GFR calc non Af Amer 58 (*)    All other components within normal limits  CBC - Abnormal; Notable for the following components:   WBC 12.6 (*)    All other components within normal limits  URINALYSIS, COMPLETE (UACMP) WITH MICROSCOPIC - Abnormal; Notable for the following components:   Color, Urine YELLOW (*)    APPearance HAZY (*)    Hgb urine dipstick LARGE (*)    Protein, ur 100 (*)    Leukocytes,Ua MODERATE (*)    RBC / HPF >50 (*)    WBC, UA >50 (*)    All other components within normal limits  URINE CULTURE  LIPASE, BLOOD   ____________________________________________  EKG  None ____________________________________________  RADIOLOGY  CT scan demonstrates left-sided obstructive uropathy due to 5 mm stone ____________________________________________   PROCEDURES  Procedure(s) performed: No  Procedures   Critical Care performed: No ____________________________________________   INITIAL IMPRESSION / ASSESSMENT AND PLAN / ED COURSE  Pertinent labs & imaging results that were available during my care of the patient were reviewed by me and considered in my medical decision making (see chart for details).  Patient presents with left-sided pain, primarily left lower quadrant he is tender there, suspicious for diverticulitis, ureterolithiasis, UTI also a possibility.  We will obtain CT renal stone study, labs overall reassuring, mild elevation of white blood cell count.  Will treat with IM Toradol  CT scan demonstrates a  5 mm stone proximal left ureter.  Patient's pain is resolved after the Toradol.  Urinalysis demonstrates a mixed picture, given obstructive stone and possible UTI I recommended admission for IV antibiotics and observation however the patient has refused as he needs to drive his wife home and she is unable to drive on her own.  Given this situation I recommended that we start him on p.o. antibiotics with strict return precautions to which he agreed    ____________________________________________   FINAL CLINICAL IMPRESSION(S) / ED DIAGNOSES  Final diagnoses:  Kidney stone        Note:  This document was prepared using Dragon voice recognition software and may include unintentional dictation errors.   Jene EveryKinner, Dontaye Hur, MD 05/16/19 [phone]

## 2019-05-16 NOTE — ED Notes (Signed)
Pt called out from restroom where pt was yelling that he was hurting. Pt helped into wheelchair, results reviewed. Pt placed near front desk and given warm blanket.

## 2019-05-19 ENCOUNTER — Encounter: Payer: Self-pay | Admitting: Emergency Medicine

## 2019-05-19 ENCOUNTER — Emergency Department
Admission: EM | Admit: 2019-05-19 | Discharge: 2019-05-19 | Disposition: A | Discharge status: Home / Self Care | Payer: Non-veteran care | Attending: Emergency Medicine | Admitting: Emergency Medicine

## 2019-05-19 ENCOUNTER — Emergency Department: Payer: Non-veteran care

## 2019-05-19 ENCOUNTER — Other Ambulatory Visit: Payer: Self-pay

## 2019-05-19 DIAGNOSIS — K567 Ileus, unspecified: Secondary | ICD-10-CM | POA: Insufficient documentation

## 2019-05-19 DIAGNOSIS — Z91040 Latex allergy status: Secondary | ICD-10-CM | POA: Diagnosis not present

## 2019-05-19 DIAGNOSIS — Z982 Presence of cerebrospinal fluid drainage device: Secondary | ICD-10-CM | POA: Insufficient documentation

## 2019-05-19 DIAGNOSIS — Z79899 Other long term (current) drug therapy: Secondary | ICD-10-CM | POA: Insufficient documentation

## 2019-05-19 DIAGNOSIS — N132 Hydronephrosis with renal and ureteral calculous obstruction: Secondary | ICD-10-CM | POA: Insufficient documentation

## 2019-05-19 DIAGNOSIS — N200 Calculus of kidney: Secondary | ICD-10-CM

## 2019-05-19 DIAGNOSIS — R109 Unspecified abdominal pain: Secondary | ICD-10-CM | POA: Diagnosis present

## 2019-05-19 DIAGNOSIS — G918 Other hydrocephalus: Secondary | ICD-10-CM | POA: Insufficient documentation

## 2019-05-19 LAB — COMPREHENSIVE METABOLIC PANEL
ALT: 18 U/L (ref 0–44)
AST: 16 U/L (ref 15–41)
Albumin: 3.7 g/dL (ref 3.5–5.0)
Alkaline Phosphatase: 65 U/L (ref 38–126)
Anion gap: 8 (ref 5–15)
BUN: 17 mg/dL (ref 6–20)
CO2: 23 mmol/L (ref 22–32)
Calcium: 9.1 mg/dL (ref 8.9–10.3)
Chloride: 108 mmol/L (ref 98–111)
Creatinine, Ser: 1.59 mg/dL — ABNORMAL HIGH (ref 0.61–1.24)
GFR calc Af Amer: 60 mL/min — ABNORMAL LOW (ref >60)
GFR calc non Af Amer: 52 mL/min — ABNORMAL LOW (ref >60)
Glucose, Bld: 107 mg/dL — ABNORMAL HIGH (ref 70–99)
Potassium: 3.8 mmol/L (ref 3.5–5.1)
Sodium: 139 mmol/L (ref 135–145)
Total Bilirubin: 1 mg/dL (ref 0.3–1.2)
Total Protein: 6.8 g/dL (ref 6.5–8.1)

## 2019-05-19 LAB — URINALYSIS, COMPLETE (UACMP) WITH MICROSCOPIC
Bacteria, UA: NONE SEEN
Bilirubin Urine: NEGATIVE
Glucose, UA: NEGATIVE mg/dL
Ketones, ur: NEGATIVE mg/dL
Nitrite: NEGATIVE
Protein, ur: 100 mg/dL — AB
Specific Gravity, Urine: 1.018 (ref 1.005–1.030)
pH: 7 (ref 5.0–8.0)

## 2019-05-19 LAB — CBC
HCT: 40 % (ref 39.0–52.0)
Hemoglobin: 13.5 g/dL (ref 13.0–17.0)
MCH: 28.8 pg (ref 26.0–34.0)
MCHC: 33.8 g/dL (ref 30.0–36.0)
MCV: 85.5 fL (ref 80.0–100.0)
Platelets: 214 10*3/uL (ref 150–400)
RBC: 4.68 MIL/uL (ref 4.22–5.81)
RDW: 13.2 % (ref 11.5–15.5)
WBC: 10.9 10*3/uL — ABNORMAL HIGH (ref 4.0–10.5)
nRBC: 0 % (ref 0.0–0.2)

## 2019-05-19 LAB — URINE CULTURE: Culture: 20000 — AB

## 2019-05-19 LAB — LIPASE, BLOOD: Lipase: 29 U/L (ref 11–51)

## 2019-05-19 MED ORDER — SODIUM CHLORIDE 0.9 % IV BOLUS
1000.0000 mL | Freq: Once | INTRAVENOUS | Status: AC
  Administered 2019-05-19: 1000 mL via INTRAVENOUS

## 2019-05-19 MED ORDER — FENTANYL CITRATE (PF) 100 MCG/2ML IJ SOLN
50.0000 ug | Freq: Once | INTRAMUSCULAR | Status: AC
  Administered 2019-05-19: 19:00:00 50 ug via INTRAVENOUS
  Filled 2019-05-19: qty 2

## 2019-05-19 NOTE — ED Triage Notes (Signed)
Patient presents to the ED with left lower abdominal pain.  Patient states he was recently in the ED and diagnosed with a UTI and a kidney stone.  Patient states he does not feel he has passed the kidney stone.  Patient states he has been taking the antibiotic for the UTI.  Patient states, "he gave me a choice to stay or go home, I wish I had stayed."  Patient also reports it has been 5 days since he last had a bowel movement.

## 2019-05-19 NOTE — ED Notes (Signed)
No further testing at this time after consulting with Dr. Kerman Passey.

## 2019-05-19 NOTE — ED Triage Notes (Signed)
FIRST NURSE NOTE-here for pain from kidney stone, reports has not passed it.  Family also reports he has not had bowel movement in 5 days. Ambulatory. NAD

## 2019-05-19 NOTE — ED Provider Notes (Signed)
[facility] Emergency Department Provider Note  ____________________________________________   First MD Initiated Contact with Patient 05/19/19 1528     (approximate)  I have reviewed the triage vital signs and the nursing notes.   HISTORY  Chief Complaint Abdominal Pain    HPI Timothy Mullins is a 43 y.o. male with hydrocephalus with VP shunt, urinary retention who self caths, seizures, spina bifida who presents with abdominal pain.  Patient was seen on 9/4 for left-sided abdominal pain.  Patient's urine was consistent with concern for UTI but he also had a stone on a CT scan.  He was recommended admission but declined.  He was sent home on Keflex.  Since then he has not had a bowel movement for 5 days.  He has been taking the oxycodone to help with some pain but he is unsure how much he is taken.  He is now having lower abdominal discomfort that is intermittent, constant, nothing makes it better, nothing makes it worse, feels like he needs to have a bowel movement.  He denies any fevers.  No vomiting.  No fevers.    Past Medical History:  Diagnosis Date  . Hydrocephalus ([place])   . Neurogenic bladder   . Renal disorder   . Seizures ([place])   . Spina bifida ([place])     There are no active problems to display for this patient.   Past Surgical History:  Procedure Laterality Date  . CHOLECYSTECTOMY    . VENTRICULOPERITONEAL SHUNT      Prior to Admission medications   Medication Sig Start Date End Date Taking? Authorizing Provider  brompheniramine-pseudoephedrine-DM 30-2-10 MG/5ML syrup Take 2.5 mLs by mouth 4 (four) times daily as needed. 11/03/17   Laban Emperor, PA-C  cephALEXin (KEFLEX) 500 MG capsule Take 1 capsule (500 mg total) by mouth 3 (three) times daily for 7 days. 05/16/19 05/23/19  Lavonia Drafts, MD  fluticasone (FLONASE) 50 MCG/ACT nasal spray Place 2 sprays into both nostrils daily. 11/03/17 11/03/18  Laban Emperor, PA-C  HYDROcodone-acetaminophen  (NORCO/VICODIN) 5-325 MG tablet Take 1 tablet by mouth every 4 (four) hours as needed for moderate pain. 05/16/19   Lavonia Drafts, MD  naproxen (NAPROSYN) 500 MG tablet Take 1 tablet (500 mg total) by mouth 2 (two) times daily with a meal. 05/16/19   Lavonia Drafts, MD  ondansetron (ZOFRAN ODT) 4 MG disintegrating tablet Take 1 tablet (4 mg total) by mouth every 8 (eight) hours as needed. 05/16/19   Lavonia Drafts, MD  tamsulosin (FLOMAX) 0.4 MG CAPS capsule Take 1 capsule (0.4 mg total) by mouth daily. 05/16/19   Lavonia Drafts, MD    Allergies Ativan [lorazepam], Benadryl [diphenhydramine hcl (sleep)], Iodine, and Latex  No family history on file.  Social History Social History   Tobacco Use  . Smoking status: Never Smoker  . Smokeless tobacco: Never Used  Substance Use Topics  . Alcohol use: No  . Drug use: Not on file      Review of Systems Constitutional: No fever/chills Eyes: No visual changes. ENT: No sore throat. Cardiovascular: Denies chest pain. Respiratory: Denies shortness of breath. Gastrointestinal: Positive abdominal pain and flank tenderness Genitourinary: Negative for dysuria. Musculoskeletal: Negative for back pain. Skin: Negative for rash. Neurological: Negative for headaches, focal weakness or numbness. All other ROS negative ____________________________________________   PHYSICAL EXAM:  VITAL SIGNS: Blood pressure (!) 131/97, pulse 93, temperature 98.4 F (36.9 C), temperature source Oral, resp. rate 17, height 5' 2" (1.575 m), weight 75.8 kg,  SpO2 100 %.  Constitutional: Alert and oriented. Well appearing and in no acute distress. Eyes: Conjunctivae are normal. EOMI. Head: Atraumatic. Nose: No congestion/rhinnorhea. Mouth/Throat: Mucous membranes are moist.   Neck: No stridor. Trachea Midline. FROM Cardiovascular: Normal rate, regular rhythm. Grossly normal heart sounds.  Good peripheral circulation. Respiratory: Normal respiratory effort.  No  retractions. Lungs CTAB. Gastrointestinal: Soft and nontender some suprapubic tenderness.  No distention. No abdominal bruits.  No rebound, no guarding Musculoskeletal: No lower extremity tenderness nor edema.  No joint effusions. Neurologic:  Normal speech and language. No gross focal neurologic deficits are appreciated.  Skin:  Skin is warm, dry and intact. No rash noted. Psychiatric: Mood and affect are normal. Speech and behavior are normal. GU: Deferred   ____________________________________________   LABS (all labs ordered are listed, but only abnormal results are displayed)  Labs Reviewed  COMPREHENSIVE METABOLIC PANEL - Abnormal; Notable for the following components:      Result Value   Glucose, Bld 107 (*)    Creatinine, Ser 1.59 (*)    GFR calc non Af Amer 52 (*)    GFR calc Af Amer 60 (*)    All other components within normal limits  CBC - Abnormal; Notable for the following components:   WBC 10.9 (*)    All other components within normal limits  URINALYSIS, COMPLETE (UACMP) WITH MICROSCOPIC - Abnormal; Notable for the following components:   Color, Urine YELLOW (*)    APPearance TURBID (*)    Hgb urine dipstick MODERATE (*)    Protein, ur 100 (*)    Leukocytes,Ua TRACE (*)    All other components within normal limits  LIPASE, BLOOD   ____________________________________________  RADIOLOGY   Official radiology report(s): Ct Renal Stone Study  Result Date: 05/19/2019 CLINICAL DATA:  Left lower abdominal pain/flank pain. Recent renal stone study with renal stone. Taking opioids. Constipation for 5 days. Eval for SBO. Shunt. Hx of cholecystectomy. Allergic to iodine. EXAM: CT ABDOMEN AND PELVIS WITHOUT CONTRAST TECHNIQUE: Multidetector CT imaging of the abdomen and pelvis was performed following the standard protocol without IV contrast. COMPARISON:  CT renal stone 05/16/2019 FINDINGS: Lower chest: Small hiatal hernia. Lung bases are clear. Hepatobiliary: No focal  liver abnormality is seen. No gallstones, gallbladder wall thickening, or biliary dilatation. Pancreas: Unremarkable. No pancreatic ductal dilatation or surrounding inflammatory changes. Spleen: Normal in size without focal abnormality. Adrenals/Urinary Tract: Adrenal glands are unremarkable. There is again a 5 mm obstructing stone in the proximal left ureter with mild upstream hydronephrosis. There is an additional 4 mm calculus in the left kidney. Right renal cyst. No right renal calculi identified. Stomach/Bowel: Stomach is within normal limits. Appendix appears normal. There is a focally distended loop of colon in the left upper quadrant measuring 5.6 cm in diameter with smooth tapering on either side. There are no bowel inflammatory changes. There are no dilated loops of small bowel. Vascular/Lymphatic: No significant vascular findings are present. No enlarged abdominal or pelvic lymph nodes. Reproductive: Coarse calcifications in the prostate. Other: No abdominal wall hernia or abnormality. No abdominopelvic ascites. Shunt catheter in place. Musculoskeletal: No acute or significant osseous findings. IMPRESSION: 1. Redemonstrated 5 mm obstructing stone in the proximal left ureter with mild upstream hydronephrosis. 2. There is a focally distended loop of colon in the left upper quadrant with smooth tapering on either side. No bowel inflammatory changes. Findings may represent a focal ileus. Electronically Signed   By: Audie Pinto M.D.   On: 05/19/2019  17:09    ____________________________________________   PROCEDURES  Procedure(s) performed (including Critical Care):  Procedures   ____________________________________________   INITIAL IMPRESSION / ASSESSMENT AND PLAN / ED COURSE  Gregg Winchell was evaluated in Emergency Department on 05/19/2019 for the symptoms described in the history of present illness. He was evaluated in the context of the global COVID-19 pandemic, which necessitated  consideration that the patient might be at risk for infection with the SARS-CoV-2 virus that causes COVID-19. Institutional protocols and algorithms that pertain to the evaluation of patients at risk for COVID-19 are in a state of rapid change based on information released by regulatory bodies including the CDC and federal and state organizations. These policies and algorithms were followed during the patient's care in the ED.    Patient has a history of E. coli bacteremia however he had some slight tachycardia when he first got here but no other sirs criteria were met.  His UA did grow E. coli but it was sensitive to the Keflex.  At this time he does not seem to have a septic stone given no fevers.  Consider this secondary to some constipation so will do rectal exam.  Given reported no bowel movement and VP shunt in place will get CT scan to ensure that there is no SBO.  Will get repeat UA to make sure that there is no signs of infection.  Will check labs to evaluate for AKI or electrolyte abnormalities  Urine without evidence of UTI.  Does have RBCs in it consistent with history of kidney stone.  Kidney function is at 1.6 slightly elevated from 3 days ago 1.44.  White count is at 10.9 lower than baseline.  Unable to do CT with contrast due to patient's allergy.  CT without shows the 5 mm obstructing stone and a focally distended loop of colon concerning for possible ileus.  6:55 PM discussed with Dr. Corena Pilgrim of radiology.  They do not think a CT with PO contrast would be necessary at this time.  They have low suspicion for SBO given the intestines look good.  There is not really a ton of stool in there either therefore enema would probably not be helpful. Rads said if he continues to have pain or it worsens could get repeat xray and should be able to see the area.   Rediscussed with patient.  Continues to have pain is more suprapubic in nature.  Patient is well-appearing his abdomen is soft otherwise  no rebound or guarding.  He is had not had any vomiting since being here is unclear if this pain is secondary to the possible ileus versus his kidney stone.  However given his rising kidney function will discuss with the urology team.  Discussed with Dr. Bernardo Heater about the elevated kidney function in the setting of a 5 mm stone.  He is acute patient following up in clinic.  We will try to get him set up for lithotripsy on Thursday.  At this time patient does not meet sirs criteria.  He has had no vomiting and continues to appear well with some mild suprapubic discomfort.  Patient feels comfortable with discharge home.  ____________________________________________   FINAL CLINICAL IMPRESSION(S) / ED DIAGNOSES   Final diagnoses:  Ileus ([place])  Kidney stone      MEDICATIONS GIVEN DURING THIS VISIT:  Medications  sodium chloride 0.9 % bolus 1,000 mL (0 mLs Intravenous Stopped 05/19/19 1931)  fentaNYL (SUBLIMAZE) injection 50 mcg (50 mcg Intravenous Given 05/19/19 1848)  ED Discharge Orders    None       Note:  This document was prepared using Dragon voice recognition software and may include unintentional dictation errors.   Vanessa Hancock, MD 05/19/19 2032

## 2019-05-19 NOTE — Discharge Instructions (Addendum)
Your CT shows your stone.  It is also possible that you have an ileus developing.  There is no great treatment for ileus except for just time and liquid diet.  Try to use her pain medication sparingly.  Your bowels do not show a ton of stool in them.  I discussed her case with the urologist who is okay with the following up.  Call them tomorrow to make sure you get scheduled an appointment.  They said that they are going to try to get you in for possible lithotripsy on Thursday.  If your abdominal pain is not resolving this radiology said that you get a repeat x-ray in a few days to make sure that this is not progressing to a bowel obstruction.  If you develop vomiting or fevers you should return to the ER.   1. Redemonstrated 5 mm obstructing stone in the proximal left ureter with mild upstream hydronephrosis. 2. There is a focally distended loop of colon in the left upper quadrant with smooth tapering on either side. No bowel inflammatory changes. Findings may represent a focal ileus.

## 2019-05-20 ENCOUNTER — Telehealth: Payer: Self-pay | Admitting: Urology

## 2019-05-20 NOTE — Telephone Encounter (Signed)
-----   Message from Kyra Manges, [place] sent at 05/20/2019  8:50 AM EDT -----  ----- Message ----- From: Abbie Sons, MD Sent: 05/20/2019   7:40 AM EDT To: [facility]  Anyone have any time today or tomorrow to see patient for possible lithotripsy this Thursday?

## 2019-05-20 NOTE — Telephone Encounter (Signed)
Left message to cb to schedule NP app with any MD today or tomorrow Per Shawnie Pons

## 2019-05-20 NOTE — Telephone Encounter (Signed)
-----   Message from Carrie M Garrison, CMA sent at 05/20/2019  8:50 AM EDT ----- ° °----- Message ----- °From: Stoioff, Scott C, MD °Sent: 05/20/2019   7:40 AM EDT °To: [facility] ° °Anyone have any time today or tomorrow to see patient for possible lithotripsy this Thursday? ° ° °

## 2019-05-20 NOTE — Telephone Encounter (Signed)
Patient has an app 05-20-19 with Theola Sequin

## 2019-05-21 ENCOUNTER — Other Ambulatory Visit: Payer: Self-pay

## 2019-05-21 ENCOUNTER — Ambulatory Visit
Admission: RE | Admit: 2019-05-21 | Discharge: 2019-05-21 | Disposition: A | Discharge status: Home / Self Care | Payer: Non-veteran care | Source: Ambulatory Visit | Attending: Urology | Admitting: Urology

## 2019-05-21 ENCOUNTER — Encounter: Payer: Self-pay | Admitting: Urology

## 2019-05-21 ENCOUNTER — Ambulatory Visit (INDEPENDENT_AMBULATORY_CARE_PROVIDER_SITE_OTHER): Payer: Non-veteran care | Admitting: Urology

## 2019-05-21 ENCOUNTER — Other Ambulatory Visit: Payer: Self-pay | Admitting: Radiology

## 2019-05-21 DIAGNOSIS — F329 Major depressive disorder, single episode, unspecified: Secondary | ICD-10-CM | POA: Insufficient documentation

## 2019-05-21 DIAGNOSIS — N201 Calculus of ureter: Secondary | ICD-10-CM | POA: Insufficient documentation

## 2019-05-21 DIAGNOSIS — M818 Other osteoporosis without current pathological fracture: Secondary | ICD-10-CM | POA: Insufficient documentation

## 2019-05-21 DIAGNOSIS — F32A Depression, unspecified: Secondary | ICD-10-CM | POA: Insufficient documentation

## 2019-05-21 DIAGNOSIS — Z01818 Encounter for other preprocedural examination: Secondary | ICD-10-CM

## 2019-05-21 DIAGNOSIS — R339 Retention of urine, unspecified: Secondary | ICD-10-CM | POA: Insufficient documentation

## 2019-05-21 DIAGNOSIS — N319 Neuromuscular dysfunction of bladder, unspecified: Secondary | ICD-10-CM | POA: Diagnosis not present

## 2019-05-21 DIAGNOSIS — K219 Gastro-esophageal reflux disease without esophagitis: Secondary | ICD-10-CM | POA: Insufficient documentation

## 2019-05-21 NOTE — H&P (View-Only) (Signed)
05/21/2019 3:39 PM   Timothy Mullins 04/23/1976 [id number]  Referring provider: Christ KickMock, Michele D, PA-C [address] [place],  [place] [id number]  Chief Complaint  Patient presents with  . Nephrolithiasis    New Patient    HPI:  Timothy Mullins was seen in the emergency department for left flank pain on 05/16/2019.  A CT scan of the abdomen and pelvis revealed a left proximal 5 mm stone (ssd 12 cm, HU 1283, possible it is visible on scout). It is outside of the renal shadow. There was a small LLP stone.  He was scheduled today to be evaluated for possible lithotripsy tomorrow, but has not had a KUB.  His UA was negative for nitrite, no bacteria, 21-50 red cells.  His creatinine has drifted up from 1.03 to 1.44 to 1.59.  Today, has passed a few little "black shavings". Pain better. He thought the stone was breaking up. He felt like his bladder was "filling up". He also has frequency and urgency. He started tamsulosin.   [facility] with e coli bactermia last month which was resistant to FQ and sensitive to cefazolin.   He has no prior h/o stones. He takes topomax and lamictal.   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 He follows at [facility]. He caths about every 4 hours. HE has h/o hydrocephalus and shunt. On oxybutynin. He has h/o urethral sx and DVIU.    UA today with no bacteria. 11-30 wbc and >30 rbc.   CareEverywhere notes reviewed.    PMH: Past Medical History:  Diagnosis Date  . Hydrocephalus ([facility])   . Neurogenic bladder   . Renal disorder   . Seizures ([facility])   . Spina bifida [facility](HCC)     Surgical History: Past Surgical History:  Procedure Laterality Date  . CHOLECYSTECTOMY    . VENTRICULOPERITONEAL SHUNT      Home Medications:  Allergies as of 05/21/2019      Reactions   Ativan [lorazepam] Other (See Comments)   "see thing"   Benadryl [diphenhydramine Hcl (sleep)] Hives   Iodine Rash   Latex Rash      Medication List       Accurate as  of May 21, 2019  3:39 PM. If you have any questions, ask your nurse or doctor.        brompheniramine-pseudoephedrine-DM 30-2-10 MG/5ML syrup Take 2.5 mLs by mouth 4 (four) times daily as needed.   cephALEXin 500 MG capsule Commonly known as: KEFLEX Take 1 capsule (500 mg total) by mouth 3 (three) times daily for 7 days.   fluticasone 50 MCG/ACT nasal spray Commonly known as: Flonase Place 2 sprays into both nostrils daily.   HYDROcodone-acetaminophen 5-325 MG tablet Commonly known as: NORCO/VICODIN Take 1 tablet by mouth every 4 (four) hours as needed for moderate pain.   naproxen 500 MG tablet Commonly known as: Naprosyn Take 1 tablet (500 mg total) by mouth 2 (two) times daily with a meal.   ondansetron 4 MG disintegrating tablet Commonly known as: Zofran ODT Take 1 tablet (4 mg total) by mouth every 8 (eight) hours as needed.   tamsulosin 0.4 MG Caps capsule Commonly known as: Flomax Take 1 capsule (0.4 mg total) by mouth daily.       Allergies:  Allergies  Allergen Reactions  . Ativan [Lorazepam] Other (See Comments)    "see thing"  . Benadryl [Diphenhydramine Hcl (Sleep)] Hives  .  Iodine Rash  . Latex Rash    Family History: No family history on file.  Social History:  reports that he has never smoked. He has never used smokeless tobacco. He reports that he does not drink alcohol. No history on file for drug.  ROS:                                        Physical Exam: There were no vitals taken for this visit.  Constitutional:  Alert and oriented, No acute distress. HEENT: [place] AT, moist mucus membranes.  Trachea midline, no masses. Cardiovascular: No clubbing, cyanosis, or edema. Respiratory: Normal respiratory effort, no increased work of breathing. GI: Abdomen is soft, nontender, nondistended, no abdominal masses Lymph: No cervical or inguinal lymphadenopathy. Skin: No rashes, bruises or suspicious lesions. Neurologic:  Grossly intact, no focal deficits, moving all 4 extremities. Psychiatric: Normal mood and affect.  Laboratory Data: Lab Results  Component Value Date   WBC 10.9 (H) 05/19/2019   HGB 13.5 05/19/2019   HCT 40.0 05/19/2019   MCV 85.5 05/19/2019   PLT 214 05/19/2019    Lab Results  Component Value Date   CREATININE 1.59 (H) 05/19/2019    No results found for: PSA  No results found for: TESTOSTERONE  No results found for: HGBA1C  Urinalysis    Component Value Date/Time   COLORURINE YELLOW (A) 05/19/2019 1242   APPEARANCEUR TURBID (A) 05/19/2019 1242   LABSPEC 1.018 05/19/2019 1242   PHURINE 7.0 05/19/2019 1242   GLUCOSEU NEGATIVE 05/19/2019 1242   HGBUR MODERATE (A) 05/19/2019 1242   BILIRUBINUR NEGATIVE 05/19/2019 1242   KETONESUR NEGATIVE 05/19/2019 1242   PROTEINUR 100 (A) 05/19/2019 1242   NITRITE NEGATIVE 05/19/2019 1242   LEUKOCYTESUR TRACE (A) 05/19/2019 1242    Lab Results  Component Value Date   BACTERIA NONE SEEN 05/19/2019    Pertinent Imaging: CT  No results found for this or any previous visit. No results found for this or any previous visit. No results found for this or any previous visit. No results found for this or any previous visit. No results found for this or any previous visit. No results found for this or any previous visit. No results found for this or any previous visit. Results for orders placed during the hospital encounter of 05/19/19  CT Renal Stone Study   Narrative CLINICAL DATA:  Left lower abdominal pain/flank pain. Recent renal stone study with renal stone. Taking opioids. Constipation for 5 days. Eval for SBO. Shunt. Hx of cholecystectomy. Allergic to iodine.  EXAM: CT ABDOMEN AND PELVIS WITHOUT CONTRAST  TECHNIQUE: Multidetector CT imaging of the abdomen and pelvis was performed following the standard protocol without IV contrast.  COMPARISON:  CT renal stone 05/16/2019  FINDINGS: Lower chest: Small hiatal hernia.  Lung bases are clear.  Hepatobiliary: No focal liver abnormality is seen. No gallstones, gallbladder wall thickening, or biliary dilatation.  Pancreas: Unremarkable. No pancreatic ductal dilatation or surrounding inflammatory changes.  Spleen: Normal in size without focal abnormality.  Adrenals/Urinary Tract: Adrenal glands are unremarkable. There is again a 5 mm obstructing stone in the proximal left ureter with mild upstream hydronephrosis. There is an additional 4 mm calculus in the left kidney. Right renal cyst. No right renal calculi identified.  Stomach/Bowel: Stomach is within normal limits. Appendix appears normal. There is a focally distended loop of colon in the left upper quadrant   measuring 5.6 cm in diameter with smooth tapering on either side. There are no bowel inflammatory changes. There are no dilated loops of small bowel.  Vascular/Lymphatic: No significant vascular findings are present. No enlarged abdominal or pelvic lymph nodes.  Reproductive: Coarse calcifications in the prostate.  Other: No abdominal wall hernia or abnormality. No abdominopelvic ascites. Shunt catheter in place.  Musculoskeletal: No acute or significant osseous findings.  IMPRESSION: 1. Redemonstrated 5 mm obstructing stone in the proximal left ureter with mild upstream hydronephrosis. 2. There is a focally distended loop of colon in the left upper quadrant with smooth tapering on either side. No bowel inflammatory changes. Findings may represent a focal ileus.   Electronically Signed   By: Audie Pinto M.D.   On: 05/19/2019 17:09     Assessment & Plan:    1. Left ureteral stone - discussed the nature r/b/a to continued stone passage, off label use of alpha blockers, left ESWL or URS/HLL/stent. He will proceed with ESWL tomorrow understanding the stone may not be visible or may have passed. He wasn't COVID tested in ED, so he may need to wait until next week.  Check KUB. I  discussed one of my colleagues would likely be doing the procedure.  - Urinalysis, Complete  2. NGB - stable on CIC. Follows regularly at [facility]. UA today with no bacteria.   No follow-ups on file.  Festus Aloe, MD  [facility] [address] [place], [place] [address] [phone]

## 2019-05-21 NOTE — Progress Notes (Signed)
05/21/2019 3:39 PM   Timothy Mullins 04/23/1976 [id number]  Referring provider: Christ KickMock, Michele D, PA-C [address] [place],  [place] [id number]  Chief Complaint  Patient presents with  . Nephrolithiasis    New Patient    HPI:  Timothy Mullins was seen in the emergency department for left flank pain on 05/16/2019.  A CT scan of the abdomen and pelvis revealed a left proximal 5 mm stone (ssd 12 cm, HU 1283, possible it is visible on scout). It is outside of the renal shadow. There was a small LLP stone.  He was scheduled today to be evaluated for possible lithotripsy tomorrow, but has not had a KUB.  His UA was negative for nitrite, no bacteria, 21-50 red cells.  His creatinine has drifted up from 1.03 to 1.44 to 1.59.  Today, has passed a few little "black shavings". Pain better. He thought the stone was breaking up. He felt like his bladder was "filling up". He also has frequency and urgency. He started tamsulosin.   [facility] with e coli bactermia last month which was resistant to FQ and sensitive to cefazolin.   He has no prior h/o stones. He takes topomax and lamictal.   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 He follows at [facility]. He caths about every 4 hours. HE has h/o hydrocephalus and shunt. On oxybutynin. He has h/o urethral sx and DVIU.    UA today with no bacteria. 11-30 wbc and >30 rbc.   CareEverywhere notes reviewed.    PMH: Past Medical History:  Diagnosis Date  . Hydrocephalus (HCC)   . Neurogenic bladder   . Renal disorder   . Seizures (HCC)   . Spina bifida [facility](HCC)     Surgical History: Past Surgical History:  Procedure Laterality Date  . CHOLECYSTECTOMY    . VENTRICULOPERITONEAL SHUNT      Home Medications:  Allergies as of 05/21/2019      Reactions   Ativan [lorazepam] Other (See Comments)   "see thing"   Benadryl [diphenhydramine Hcl (sleep)] Hives   Iodine Rash   Latex Rash      Medication List       Accurate as  of May 21, 2019  3:39 PM. If you have any questions, ask your nurse or doctor.        brompheniramine-pseudoephedrine-DM 30-2-10 MG/5ML syrup Take 2.5 mLs by mouth 4 (four) times daily as needed.   cephALEXin 500 MG capsule Commonly known as: KEFLEX Take 1 capsule (500 mg total) by mouth 3 (three) times daily for 7 days.   fluticasone 50 MCG/ACT nasal spray Commonly known as: Flonase Place 2 sprays into both nostrils daily.   HYDROcodone-acetaminophen 5-325 MG tablet Commonly known as: NORCO/VICODIN Take 1 tablet by mouth every 4 (four) hours as needed for moderate pain.   naproxen 500 MG tablet Commonly known as: Naprosyn Take 1 tablet (500 mg total) by mouth 2 (two) times daily with a meal.   ondansetron 4 MG disintegrating tablet Commonly known as: Zofran ODT Take 1 tablet (4 mg total) by mouth every 8 (eight) hours as needed.   tamsulosin 0.4 MG Caps capsule Commonly known as: Flomax Take 1 capsule (0.4 mg total) by mouth daily.       Allergies:  Allergies  Allergen Reactions  . Ativan [Lorazepam] Other (See Comments)    "see thing"  . Benadryl [Diphenhydramine Hcl (Sleep)] Hives  .  Iodine Rash  . Latex Rash    Family History: No family history on file.  Social History:  reports that he has never smoked. He has never used smokeless tobacco. He reports that he does not drink alcohol. No history on file for drug.  ROS:                                        Physical Exam: There were no vitals taken for this visit.  Constitutional:  Alert and oriented, No acute distress. HEENT: [place] AT, moist mucus membranes.  Trachea midline, no masses. Cardiovascular: No clubbing, cyanosis, or edema. Respiratory: Normal respiratory effort, no increased work of breathing. GI: Abdomen is soft, nontender, nondistended, no abdominal masses Lymph: No cervical or inguinal lymphadenopathy. Skin: No rashes, bruises or suspicious lesions. Neurologic:  Grossly intact, no focal deficits, moving all 4 extremities. Psychiatric: Normal mood and affect.  Laboratory Data: Lab Results  Component Value Date   WBC 10.9 (H) 05/19/2019   HGB 13.5 05/19/2019   HCT 40.0 05/19/2019   MCV 85.5 05/19/2019   PLT 214 05/19/2019    Lab Results  Component Value Date   CREATININE 1.59 (H) 05/19/2019    No results found for: PSA  No results found for: TESTOSTERONE  No results found for: HGBA1C  Urinalysis    Component Value Date/Time   COLORURINE YELLOW (A) 05/19/2019 1242   APPEARANCEUR TURBID (A) 05/19/2019 1242   LABSPEC 1.018 05/19/2019 1242   PHURINE 7.0 05/19/2019 1242   GLUCOSEU NEGATIVE 05/19/2019 1242   HGBUR MODERATE (A) 05/19/2019 1242   BILIRUBINUR NEGATIVE 05/19/2019 1242   KETONESUR NEGATIVE 05/19/2019 1242   PROTEINUR 100 (A) 05/19/2019 1242   NITRITE NEGATIVE 05/19/2019 1242   LEUKOCYTESUR TRACE (A) 05/19/2019 1242    Lab Results  Component Value Date   BACTERIA NONE SEEN 05/19/2019    Pertinent Imaging: CT  No results found for this or any previous visit. No results found for this or any previous visit. No results found for this or any previous visit. No results found for this or any previous visit. No results found for this or any previous visit. No results found for this or any previous visit. No results found for this or any previous visit. Results for orders placed during the hospital encounter of 05/19/19  CT Renal Stone Study   Narrative CLINICAL DATA:  Left lower abdominal pain/flank pain. Recent renal stone study with renal stone. Taking opioids. Constipation for 5 days. Eval for SBO. Shunt. Hx of cholecystectomy. Allergic to iodine.  EXAM: CT ABDOMEN AND PELVIS WITHOUT CONTRAST  TECHNIQUE: Multidetector CT imaging of the abdomen and pelvis was performed following the standard protocol without IV contrast.  COMPARISON:  CT renal stone 05/16/2019  FINDINGS: Lower chest: Small hiatal hernia.  Lung bases are clear.  Hepatobiliary: No focal liver abnormality is seen. No gallstones, gallbladder wall thickening, or biliary dilatation.  Pancreas: Unremarkable. No pancreatic ductal dilatation or surrounding inflammatory changes.  Spleen: Normal in size without focal abnormality.  Adrenals/Urinary Tract: Adrenal glands are unremarkable. There is again a 5 mm obstructing stone in the proximal left ureter with mild upstream hydronephrosis. There is an additional 4 mm calculus in the left kidney. Right renal cyst. No right renal calculi identified.  Stomach/Bowel: Stomach is within normal limits. Appendix appears normal. There is a focally distended loop of colon in the left upper quadrant  measuring 5.6 cm in diameter with smooth tapering on either side. There are no bowel inflammatory changes. There are no dilated loops of small bowel.  Vascular/Lymphatic: No significant vascular findings are present. No enlarged abdominal or pelvic lymph nodes.  Reproductive: Coarse calcifications in the prostate.  Other: No abdominal wall hernia or abnormality. No abdominopelvic ascites. Shunt catheter in place.  Musculoskeletal: No acute or significant osseous findings.  IMPRESSION: 1. Redemonstrated 5 mm obstructing stone in the proximal left ureter with mild upstream hydronephrosis. 2. There is a focally distended loop of colon in the left upper quadrant with smooth tapering on either side. No bowel inflammatory changes. Findings may represent a focal ileus.   Electronically Signed   By: Audie Pinto M.D.   On: 05/19/2019 17:09     Assessment & Plan:    1. Left ureteral stone - discussed the nature r/b/a to continued stone passage, off label use of alpha blockers, left ESWL or URS/HLL/stent. He will proceed with ESWL tomorrow understanding the stone may not be visible or may have passed. He wasn't COVID tested in ED, so he may need to wait until next week.  Check KUB. I  discussed one of my colleagues would likely be doing the procedure.  - Urinalysis, Complete  2. NGB - stable on CIC. Follows regularly at [facility]. UA today with no bacteria.   No follow-ups on file.  Festus Aloe, MD  [facility] [address] [place], [place] [address] [phone]

## 2019-05-22 ENCOUNTER — Other Ambulatory Visit: Payer: Self-pay | Admitting: Radiology

## 2019-05-22 DIAGNOSIS — N201 Calculus of ureter: Secondary | ICD-10-CM

## 2019-05-22 LAB — URINALYSIS, COMPLETE
Bilirubin, UA: NEGATIVE
Glucose, UA: NEGATIVE
Ketones, UA: NEGATIVE
Nitrite, UA: NEGATIVE
Specific Gravity, UA: 1.02 (ref 1.005–1.030)
Urobilinogen, Ur: 1 mg/dL (ref 0.2–1.0)
pH, UA: 7.5 (ref 5.0–7.5)

## 2019-05-22 LAB — MICROSCOPIC EXAMINATION
Bacteria, UA: NONE SEEN
RBC, Urine: >30 /hpf — AB (ref 0–2)

## 2019-05-24 LAB — CULTURE, URINE COMPREHENSIVE

## 2019-05-26 ENCOUNTER — Telehealth: Payer: Self-pay | Admitting: Family Medicine

## 2019-05-26 ENCOUNTER — Other Ambulatory Visit: Payer: No Typology Code available for payment source

## 2019-05-26 ENCOUNTER — Other Ambulatory Visit
Admission: RE | Admit: 2019-05-26 | Discharge: 2019-05-26 | Disposition: A | Discharge status: Home / Self Care | Payer: Non-veteran care | Source: Ambulatory Visit | Attending: Urology | Admitting: Urology

## 2019-05-26 ENCOUNTER — Other Ambulatory Visit: Payer: Self-pay

## 2019-05-26 DIAGNOSIS — Z20828 Contact with and (suspected) exposure to other viral communicable diseases: Secondary | ICD-10-CM | POA: Diagnosis not present

## 2019-05-26 DIAGNOSIS — Z01812 Encounter for preprocedural laboratory examination: Secondary | ICD-10-CM | POA: Diagnosis present

## 2019-05-26 LAB — SARS CORONAVIRUS 2 (TAT 6-24 HRS): SARS Coronavirus 2: NEGATIVE

## 2019-05-26 MED ORDER — CIPROFLOXACIN HCL 250 MG PO TABS: 250.0000 mg | ORAL_TABLET | Freq: Two times a day (BID) | ORAL | 0 refills | Status: DC

## 2019-05-26 NOTE — Telephone Encounter (Signed)
Patient notified and he will start ABX

## 2019-05-26 NOTE — Telephone Encounter (Signed)
-----   Message from Festus Aloe, MD sent at 05/24/2019  5:36 PM EDT ----- Morey [facility] - pt does not have a pharmacy in system. Can you send in cipro 250 mg po BID for five days, #10, no refills. Let pt know urine cx mildly positive (low colony count) but given ureteral stone and ESWL Thursday will go ahead and start lower dose cipro. Thanks.   ----- Message ----- From: Kyra Manges, CMA Sent: 05/23/2019   4:40 PM EDT To: Festus Aloe, MD   ----- Message ----- From: Lavone Neri Lab Results In Sent: 05/22/2019   4:37 PM EDT To: Rowe Robert Clinical

## 2019-05-29 ENCOUNTER — Encounter
Admission: RE | Disposition: A | Discharge status: Home / Self Care | Payer: Self-pay | Source: Home / Self Care | Attending: Urology

## 2019-05-29 ENCOUNTER — Other Ambulatory Visit: Payer: Self-pay

## 2019-05-29 ENCOUNTER — Encounter: Payer: Self-pay | Admitting: *Deleted

## 2019-05-29 ENCOUNTER — Ambulatory Visit
Admission: RE | Admit: 2019-05-29 | Discharge: 2019-05-29 | Disposition: A | Discharge status: Home / Self Care | Payer: Non-veteran care | Attending: Urology | Admitting: Urology

## 2019-05-29 ENCOUNTER — Ambulatory Visit: Payer: Non-veteran care

## 2019-05-29 DIAGNOSIS — N132 Hydronephrosis with renal and ureteral calculous obstruction: Secondary | ICD-10-CM | POA: Diagnosis not present

## 2019-05-29 DIAGNOSIS — N200 Calculus of kidney: Secondary | ICD-10-CM | POA: Diagnosis present

## 2019-05-29 DIAGNOSIS — Q059 Spina bifida, unspecified: Secondary | ICD-10-CM | POA: Diagnosis not present

## 2019-05-29 DIAGNOSIS — Z982 Presence of cerebrospinal fluid drainage device: Secondary | ICD-10-CM | POA: Diagnosis not present

## 2019-05-29 DIAGNOSIS — N201 Calculus of ureter: Secondary | ICD-10-CM

## 2019-05-29 HISTORY — PX: EXTRACORPOREAL SHOCK WAVE LITHOTRIPSY: SHX1557

## 2019-05-29 SURGERY — LITHOTRIPSY, ESWL
Anesthesia: Moderate Sedation | Laterality: Left

## 2019-05-29 MED ORDER — DOCUSATE SODIUM 100 MG PO CAPS: 100.0000 mg | ORAL_CAPSULE | Freq: Two times a day (BID) | ORAL | 0 refills | Status: AC

## 2019-05-29 MED ORDER — CEFAZOLIN SODIUM-DEXTROSE 2-4 GM/100ML-% IV SOLN
INTRAVENOUS | Status: AC
  Filled 2019-05-29: qty 100

## 2019-05-29 MED ORDER — ONDANSETRON HCL 4 MG/2ML IJ SOLN
4.0000 mg | Freq: Once | INTRAMUSCULAR | Status: AC | PRN
  Administered 2019-05-29: 08:00:00 4 mg via INTRAVENOUS

## 2019-05-29 MED ORDER — HYDROCODONE-ACETAMINOPHEN 5-325 MG PO TABS: 1.0000 | ORAL_TABLET | Freq: Four times a day (QID) | ORAL | 0 refills | Status: DC | PRN

## 2019-05-29 MED ORDER — ONDANSETRON HCL 4 MG/2ML IJ SOLN
4.0000 mg | Freq: Once | INTRAMUSCULAR | Status: AC
  Administered 2019-05-29: 12:00:00 4 mg via INTRAVENOUS

## 2019-05-29 MED ORDER — ONDANSETRON HCL 4 MG/2ML IJ SOLN
INTRAMUSCULAR | Status: AC
  Administered 2019-05-29: 08:00:00 4 mg via INTRAVENOUS
  Filled 2019-05-29: qty 2

## 2019-05-29 MED ORDER — SODIUM CHLORIDE 0.9 % IV SOLN
INTRAVENOUS | Status: DC
  Administered 2019-05-29: 08:00:00 via INTRAVENOUS

## 2019-05-29 MED ORDER — ONDANSETRON HCL 4 MG/2ML IJ SOLN
INTRAMUSCULAR | Status: AC
  Administered 2019-05-29: 12:00:00 4 mg via INTRAVENOUS
  Filled 2019-05-29: qty 2

## 2019-05-29 MED ORDER — SODIUM CHLORIDE FLUSH 0.9 % IV SOLN
INTRAVENOUS | Status: AC
  Filled 2019-05-29: qty 10

## 2019-05-29 MED ORDER — CEFAZOLIN SODIUM-DEXTROSE 2-4 GM/100ML-% IV SOLN: 2.0000 g | INTRAVENOUS | Status: DC

## 2019-05-29 NOTE — Interval H&P Note (Signed)
History and Physical Interval Note:  05/29/2019 9:52 AM  Timothy Mullins  has presented today for surgery, with the diagnosis of kidney stone.  The various methods of treatment have been discussed with the patient and family. After consideration of risks, benefits and other options for treatment, the patient has consented to  Procedure(s): EXTRACORPOREAL SHOCK WAVE LITHOTRIPSY (ESWL) (Left) as a surgical intervention.  The patient's history has been reviewed, patient examined, no change in status, stable for surgery.  I have reviewed the patient's chart and labs.  Questions were answered to the patient's satisfaction.    Treated appropriately for UTI  Hollice Espy

## 2019-05-29 NOTE — Progress Notes (Signed)
Ch visited pt in pre-op. Pt shared that he was here to hv a procedure related to his kidney stones. Pt shared that he has a hx of having kidney stones and even had a large stone in his gall bladder that lead to the removal of the organ in the past. Pt also shared that he had a stint placement when he was born but has not had any other major health issues. Pt was pleasant and felt confident about the procedure and look forward to recovering at home. No further needs at this time.    05/29/19 1000  Clinical Encounter Type  Visited With Patient  Visit Type Social support;Pre-op  Stress Factors  Patient Stress Factors Health changes;Major life changes

## 2019-05-29 NOTE — Discharge Instructions (Signed)
See [facility] discharge instructions in chart.  

## 2019-05-29 NOTE — Interval H&P Note (Signed)
History and Physical Interval Note:  05/29/2019 9:13 AM  Timothy Mullins  has presented today for surgery, with the diagnosis of kidney stone.  The various methods of treatment have been discussed with the patient and family. After consideration of risks, benefits and other options for treatment, the patient has consented to  Procedure(s): EXTRACORPOREAL SHOCK WAVE LITHOTRIPSY (ESWL) (Left) as a surgical intervention.  The patient's history has been reviewed, patient examined, no change in status, stable for surgery.  I have reviewed the patient's chart and labs.  Questions were answered to the patient's satisfaction.     Hollice Espy

## 2019-06-12 ENCOUNTER — Encounter: Payer: Self-pay | Admitting: Physician Assistant

## 2019-06-12 ENCOUNTER — Ambulatory Visit: Payer: No Typology Code available for payment source | Admitting: Physician Assistant

## 2019-10-15 ENCOUNTER — Other Ambulatory Visit: Payer: Self-pay

## 2019-10-15 ENCOUNTER — Encounter: Payer: Self-pay | Admitting: Emergency Medicine

## 2019-10-15 ENCOUNTER — Emergency Department: Payer: Non-veteran care

## 2019-10-15 DIAGNOSIS — Z91040 Latex allergy status: Secondary | ICD-10-CM | POA: Diagnosis not present

## 2019-10-15 DIAGNOSIS — R0602 Shortness of breath: Secondary | ICD-10-CM | POA: Diagnosis present

## 2019-10-15 DIAGNOSIS — Z79899 Other long term (current) drug therapy: Secondary | ICD-10-CM | POA: Diagnosis not present

## 2019-10-15 DIAGNOSIS — J45901 Unspecified asthma with (acute) exacerbation: Secondary | ICD-10-CM | POA: Insufficient documentation

## 2019-10-15 LAB — CBC
HCT: 46.3 % (ref 39.0–52.0)
Hemoglobin: 15.4 g/dL (ref 13.0–17.0)
MCH: 28.7 pg (ref 26.0–34.0)
MCHC: 33.3 g/dL (ref 30.0–36.0)
MCV: 86.2 fL (ref 80.0–100.0)
Platelets: 282 10*3/uL (ref 150–400)
RBC: 5.37 MIL/uL (ref 4.22–5.81)
RDW: 13.2 % (ref 11.5–15.5)
WBC: 7.7 10*3/uL (ref 4.0–10.5)
nRBC: 0 % (ref 0.0–0.2)

## 2019-10-15 LAB — BASIC METABOLIC PANEL
Anion gap: 6 (ref 5–15)
BUN: 14 mg/dL (ref 6–20)
CO2: 24 mmol/L (ref 22–32)
Calcium: 9.2 mg/dL (ref 8.9–10.3)
Chloride: 108 mmol/L (ref 98–111)
Creatinine, Ser: 1.02 mg/dL (ref 0.61–1.24)
GFR calc Af Amer: >60 mL/min (ref >60)
GFR calc non Af Amer: >60 mL/min (ref >60)
Glucose, Bld: 102 mg/dL — ABNORMAL HIGH (ref 70–99)
Potassium: 4 mmol/L (ref 3.5–5.1)
Sodium: 138 mmol/L (ref 135–145)

## 2019-10-15 LAB — TROPONIN I (HIGH SENSITIVITY): Troponin I (High Sensitivity): 3 ng/L (ref <18)

## 2019-10-15 NOTE — ED Triage Notes (Signed)
Patient to ER for c/o shortness of breath since this afternoon. Patient reports h/o asthma that he states this feels similar to. Has used inhaler at home with no relief. Patient denies wheezing today.

## 2019-10-16 ENCOUNTER — Emergency Department
Admission: EM | Admit: 2019-10-16 | Discharge: 2019-10-16 | Disposition: A | Discharge status: Home / Self Care | Payer: Non-veteran care | Attending: Emergency Medicine | Admitting: Emergency Medicine

## 2019-10-16 DIAGNOSIS — J45901 Unspecified asthma with (acute) exacerbation: Secondary | ICD-10-CM

## 2019-10-16 HISTORY — DX: Unspecified asthma, uncomplicated: J45.909

## 2019-10-16 MED ORDER — IPRATROPIUM-ALBUTEROL 0.5-2.5 (3) MG/3ML IN SOLN
3.0000 mL | Freq: Once | RESPIRATORY_TRACT | Status: AC
  Administered 2019-10-16: 3 mL via RESPIRATORY_TRACT

## 2019-10-16 MED ORDER — PREDNISONE 20 MG PO TABS: 60.0000 mg | ORAL_TABLET | Freq: Every day | ORAL | 0 refills | Status: AC

## 2019-10-16 MED ORDER — IPRATROPIUM-ALBUTEROL 0.5-2.5 (3) MG/3ML IN SOLN
3.0000 mL | Freq: Once | RESPIRATORY_TRACT | Status: AC
  Administered 2019-10-16: 3 mL via RESPIRATORY_TRACT
  Filled 2019-10-16: qty 6

## 2019-10-16 NOTE — ED Provider Notes (Signed)
[facility] Emergency Department Provider Note  ____________________________________________  Time seen: Approximately 2:18 AM  I have reviewed the triage vital signs and the nursing notes.   HISTORY  Chief Complaint Shortness of Breath   HPI Timothy Mullins is a 44 y.o. male with a history of spina bifida, neurogenic bladder, hydrocephalus status post VP shunt, asthma, OSA on CPAP who presents for evaluation of shortness of breath.  Patient reports that his symptoms started earlier today.  The shortness of breath is constant and mild, worse with ambulation.  He denies wheezing, cough, fever, chills, chest pain, personal or family history of blood clots, recent travel immobilization, leg pain or swelling, hemoptysis, or exogenous hormones.  No sore throat, no body aches, no loss of taste or smell.  He has uses albuterol inhaler at home with no significant relief.   Past Medical History:  Diagnosis Date  . Asthma   . Hydrocephalus ([place])   . Neurogenic bladder   . Renal disorder   . Seizures ([place])   . Spina bifida [facility])     Patient Active Problem List   Diagnosis Date Noted  . Urinary retention with incomplete bladder emptying 05/21/2019  . GERD (gastroesophageal reflux disease) 05/21/2019  . 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 Depression 05/21/2019  . Urinary retention 04/15/2019  . Septic shock ([place]) 04/15/2019  . Seizure disorder ([place]) 04/15/2019  . Neuromuscular dysfunction of bladder, unspecified 04/15/2019  . Bacteremia due to Gram-negative bacteria 04/15/2019  . Sepsis ([place]) 04/14/2019  . Pyelonephritis, acute 07/05/2017  . Nephrolithiasis 07/24/2016  . OSA on CPAP 01/25/2016  . Obesity (BMI 30-39.9) 01/25/2016  . 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 S/P ventriculoperitoneal shunt 06/09/2015  . Vasovagal syncope 10/18/2014  . Tobacco dependence 10/07/2014  . Chronic headaches 03/11/2014  . Migraines 08/19/2012  . Spina  bifida with hydrocephalus ([place]) 06/03/2012  . Hereditary and idiopathic peripheral neuropathy 06/03/2012    Past Surgical History:  Procedure Laterality Date  . CHOLECYSTECTOMY    . EXTRACORPOREAL SHOCK WAVE LITHOTRIPSY Left 05/29/2019   Procedure: EXTRACORPOREAL SHOCK WAVE LITHOTRIPSY (ESWL);  Surgeon: Hollice Espy, MD;  Location: [facility] ORS;  Service: Urology;  Laterality: Left;  Marland Kitchen VENTRICULOPERITONEAL SHUNT      Prior to Admission medications   Medication Sig Start Date End Date Taking? Authorizing Provider  brompheniramine-pseudoephedrine-DM 30-2-10 MG/5ML syrup Take 2.5 mLs by mouth 4 (four) times daily as needed. 11/03/17   Laban Emperor, PA-C  ciprofloxacin (CIPRO) 250 MG tablet Take 1 tablet (250 mg total) by mouth 2 (two) times daily. Patient not taking: Reported on 05/29/2019 05/26/19   Festus Aloe, MD  docusate sodium (COLACE) 100 MG capsule Take 1 capsule (100 mg total) by mouth 2 (two) times daily. 05/29/19   Hollice Espy, MD  fluticasone (FLONASE) 50 MCG/ACT nasal spray Place 2 sprays into both nostrils daily. 11/03/17 11/03/18  Laban Emperor, PA-C  HYDROcodone-acetaminophen (NORCO/VICODIN) 5-325 MG tablet Take 1 tablet by mouth every 4 (four) hours as needed for moderate pain. Patient not taking: Reported on 05/29/2019 05/16/19   Lavonia Drafts, MD  HYDROcodone-acetaminophen (NORCO/VICODIN) 5-325 MG tablet Take 1-2 tablets by mouth every 6 (six) hours as needed for moderate pain. 05/29/19   Hollice Espy, MD  naproxen (NAPROSYN) 500 MG tablet Take 1 tablet (500 mg total) by mouth 2 (two) times daily with a meal. 05/16/19   Lavonia Drafts, MD  ondansetron (ZOFRAN ODT) 4 MG disintegrating tablet Take 1 tablet (4 mg total) by mouth every  8 (eight) hours as needed. 05/16/19   Jene Every, MD  predniSONE (DELTASONE) 20 MG tablet Take 3 tablets (60 mg total) by mouth daily for 4 days. 10/16/19 10/20/19  Nita Sickle, MD  tamsulosin (FLOMAX) 0.4 MG CAPS capsule Take 1 capsule  (0.4 mg total) by mouth daily. 05/16/19   Jene Every, MD    Allergies Ativan [lorazepam], Benadryl [diphenhydramine hcl (sleep)], Iodine, and Latex  No family history on file.  Social History Social History   Tobacco Use  . Smoking status: Never Smoker  . Smokeless tobacco: Never Used  Substance Use Topics  . Alcohol use: No  . Drug use: Not on file    Review of Systems  Constitutional: Negative for fever. Eyes: Negative for visual changes. ENT: Negative for sore throat. Neck: No neck pain  Cardiovascular: Negative for chest pain. Respiratory: + shortness of breath. Gastrointestinal: Negative for abdominal pain, vomiting or diarrhea. Genitourinary: Negative for dysuria. Musculoskeletal: Negative for back pain. Skin: Negative for rash. Neurological: Negative for headaches, weakness or numbness. Psych: No SI or HI  ____________________________________________   PHYSICAL EXAM:  VITAL SIGNS: ED Triage Vitals  Enc Vitals Group     BP 10/16/19 0054 130/85     Pulse Rate 10/16/19 0054 68     Resp 10/16/19 0054 20     Temp --      Temp src --      SpO2 10/16/19 0054 100 %     Weight 10/15/19 2031 170 lb (77.1 kg)     Height 10/15/19 2031 5\' 2"  (1.575 m)     Head Circumference --      Peak Flow --      Pain Score 10/15/19 2031 0     Pain Loc --      Pain Edu? --      Excl. in GC? --     Constitutional: Alert and oriented. Well appearing and in no apparent distress. HEENT:      Head: Normocephalic and atraumatic.         Eyes: Conjunctivae are normal. Sclera is non-icteric.       Mouth/Throat: Mucous membranes are moist.       Neck: Supple with no signs of meningismus. Cardiovascular: Regular rate and rhythm. No murmurs, gallops, or rubs. 2+ symmetrical distal pulses are present in all extremities. No JVD. Respiratory: Normal respiratory effort.  Slight decreased air movement bilaterally with no wheezing or crackles gastrointestinal: Soft, non tender, and non  distended with positive bowel sounds. No rebound or guarding. Musculoskeletal: Nontender with normal range of motion in all extremities. No edema, cyanosis, or erythema of extremities. Neurologic: Normal speech and language. Face is symmetric. Moving all extremities. No gross focal neurologic deficits are appreciated. Skin: Skin is warm, dry and intact. No rash noted. Psychiatric: Mood and affect are normal. Speech and behavior are normal.  ____________________________________________   LABS (all labs ordered are listed, but only abnormal results are displayed)  Labs Reviewed  BASIC METABOLIC PANEL - Abnormal; Notable for the following components:      Result Value   Glucose, Bld 102 (*)    All other components within normal limits  CBC  TROPONIN I (HIGH SENSITIVITY)   ____________________________________________  EKG  ED ECG REPORT I, 2032, the attending physician, personally viewed and interpreted this ECG.  Normal sinus rhythm, rate of 89, normal intervals, normal axis, no ST elevations or depressions.  Normal EKG. ____________________________________________  RADIOLOGY  I have personally reviewed the images  performed during this visit and I agree with the Radiologist's read.   Interpretation by Radiologist:  DG Chest 2 View  Result Date: 10/15/2019 CLINICAL DATA:  Shortness of breath, history of asthma EXAM: CHEST - 2 VIEW COMPARISON:  11/03/2017 FINDINGS: Cardiac shadow is within normal limits. Shunt catheter is noted extending from the right towards the left abdomen. Other catheter fragments are seen over the right neck and chest. These are stable in appearance from the prior exam. No focal infiltrate or sizable effusion is noted. Degenerative changes of thoracic spine are seen. IMPRESSION: No active cardiopulmonary disease. Electronically Signed   By: Alcide Clever M.D.   On: 10/15/2019 20:51      ____________________________________________   PROCEDURES  Procedure(s) performed: None Procedures Critical Care performed:  None ____________________________________________   INITIAL IMPRESSION / ASSESSMENT AND PLAN / ED COURSE  44 y.o. male with a history of spina bifida, neurogenic bladder, hydrocephalus status post VP shunt, asthma, OSA on CPAP who presents for evaluation of shortness of breath since this morning.  Patient is well-appearing and in no distress with normal work of breathing, normal sats, afebrile, slightly decreased air movement bilaterally with no wheezing or crackles.  Chest x-ray negative for pneumonia, pneumothorax, pulmonary edema.  EKG and troponin with no signs of ischemia.  Doubt PE with no tachypnea, no tachycardia, no hypoxia, no chest pain.  Patient is afebrile with normal white counts therefore doubt atypical pneumonia.  No other symptoms concerning for Covid.  Patient received duo nebs with resolution of his shortness of breath.  Will discharge home on steroids and albuterol rescue inhaler.  Discussed my standard return precautions and follow-up with PCP.       As part of my medical decision making, I reviewed the following data within the electronic MEDICAL RECORD NUMBER Nursing notes reviewed and incorporated, Labs reviewed , EKG interpreted , Old chart reviewed, Radiograph reviewed , Notes from prior ED visits and [place] Controlled Substance Database   Please note:  Patient was evaluated in Emergency Department today for the symptoms described in the history of present illness. Patient was evaluated in the context of the global COVID-19 pandemic, which necessitated consideration that the patient might be at risk for infection with the SARS-CoV-2 virus that causes COVID-19. Institutional protocols and algorithms that pertain to the evaluation of patients at risk for COVID-19 are in a state of rapid change based on information released by regulatory bodies including  the CDC and federal and state organizations. These policies and algorithms were followed during the patient's care in the ED.  Some ED evaluations and interventions may be delayed as a result of limited staffing during the pandemic.   ____________________________________________   FINAL CLINICAL IMPRESSION(S) / ED DIAGNOSES   Final diagnoses:  Asthma exacerbation, mild      NEW MEDICATIONS STARTED DURING THIS VISIT:  ED Discharge Orders         Ordered    predniSONE (DELTASONE) 20 MG tablet  Daily     10/16/19 0218           Note:  This document was prepared using Dragon voice recognition software and may include unintentional dictation errors.    Don Perking, [place], MD 10/16/19 [phone]

## 2019-10-18 ENCOUNTER — Encounter: Payer: Self-pay | Admitting: Emergency Medicine

## 2019-10-18 ENCOUNTER — Emergency Department
Admission: EM | Admit: 2019-10-18 | Discharge: 2019-10-18 | Disposition: A | Discharge status: Home / Self Care | Payer: Non-veteran care | Attending: Emergency Medicine | Admitting: Emergency Medicine

## 2019-10-18 ENCOUNTER — Other Ambulatory Visit: Payer: Self-pay

## 2019-10-18 DIAGNOSIS — L03012 Cellulitis of left finger: Secondary | ICD-10-CM | POA: Diagnosis not present

## 2019-10-18 DIAGNOSIS — M79642 Pain in left hand: Secondary | ICD-10-CM | POA: Diagnosis present

## 2019-10-18 MED ORDER — LIDOCAINE HCL (PF) 1 % IJ SOLN
5.0000 mL | Freq: Once | INTRAMUSCULAR | Status: AC
  Administered 2019-10-18: 5 mL
  Filled 2019-10-18: qty 5

## 2019-10-18 MED ORDER — SULFAMETHOXAZOLE-TRIMETHOPRIM 800-160 MG PO TABS: 2.0000 | ORAL_TABLET | Freq: Once | ORAL | Status: DC

## 2019-10-18 MED ORDER — SULFAMETHOXAZOLE-TRIMETHOPRIM 800-160 MG PO TABS: 2.0000 | ORAL_TABLET | Freq: Two times a day (BID) | ORAL | 0 refills | Status: AC

## 2019-10-18 NOTE — ED Notes (Signed)
Bandage applied to finger due to weeping and blood. PT educated on home care and prescription.

## 2019-10-18 NOTE — ED Notes (Signed)
Warm blanket given

## 2019-10-18 NOTE — ED Provider Notes (Signed)
[facility] Emergency Department Provider Note  ____________________________________________   First MD Initiated Contact with Patient 10/18/19 0303     (approximate)  I have reviewed the triage vital signs and the nursing notes.   HISTORY  Chief Complaint Hand Pain    HPI Timothy Mullins is a 44 y.o. male with medical history as listed below who presents for evaluation of acute onset over last 24 hours of pain and redness beneath the nail and the ring finger of his left hand .  He does notice it today.  He says he does not bite his nails but keeps his nails trimmed very short.  He has not had this current issue in the past.  No history of trauma.  Denies fever/chills, sore throat, chest pain, shortness of breath, nausea, and vomiting.  Moving the finger makes it hurt but he is able to make a fist without difficulty.  No numbness nor tingling.         Past Medical History:  Diagnosis Date  . Asthma   . Hydrocephalus (HCC)   . Neurogenic bladder   . Renal disorder   . Seizures (HCC)   . Spina bifida [facility])     Patient Active Problem List   Diagnosis Date Noted  . Urinary retention with incomplete bladder emptying 05/21/2019  . GERD (gastroesophageal reflux disease) 05/21/2019  . 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 Depression 05/21/2019  . Urinary retention 04/15/2019  . Septic shock (HCC) 04/15/2019  . Seizure disorder (HCC) 04/15/2019  . Neuromuscular dysfunction of bladder, unspecified 04/15/2019  . Bacteremia due to Gram-negative bacteria 04/15/2019  . Sepsis (HCC) 04/14/2019  . Pyelonephritis, acute 07/05/2017  . Nephrolithiasis 07/24/2016  . OSA on CPAP 01/25/2016  . Obesity (BMI 30-39.9) 01/25/2016  . 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 S/P ventriculoperitoneal shunt 06/09/2015  . Vasovagal syncope 10/18/2014  . Tobacco dependence 10/07/2014  . Chronic headaches 03/11/2014  . Migraines 08/19/2012  . Spina bifida  with hydrocephalus (HCC) 06/03/2012  . Hereditary and idiopathic peripheral neuropathy 06/03/2012    Past Surgical History:  Procedure Laterality Date  . CHOLECYSTECTOMY    . EXTRACORPOREAL SHOCK WAVE LITHOTRIPSY Left 05/29/2019   Procedure: EXTRACORPOREAL SHOCK WAVE LITHOTRIPSY (ESWL);  Surgeon: Vanna Scotland, MD;  Location: [facility] ORS;  Service: Urology;  Laterality: Left;  Marland Kitchen VENTRICULOPERITONEAL SHUNT      Prior to Admission medications   Medication Sig Start Date End Date Taking? Authorizing Provider  brompheniramine-pseudoephedrine-DM 30-2-10 MG/5ML syrup Take 2.5 mLs by mouth 4 (four) times daily as needed. 11/03/17   Enid Derry, PA-C  ciprofloxacin (CIPRO) 250 MG tablet Take 1 tablet (250 mg total) by mouth 2 (two) times daily. Patient not taking: Reported on 05/29/2019 05/26/19   Jerilee Field, MD  docusate sodium (COLACE) 100 MG capsule Take 1 capsule (100 mg total) by mouth 2 (two) times daily. 05/29/19   Vanna Scotland, MD  fluticasone (FLONASE) 50 MCG/ACT nasal spray Place 2 sprays into both nostrils daily. 11/03/17 11/03/18  Enid Derry, PA-C  HYDROcodone-acetaminophen (NORCO/VICODIN) 5-325 MG tablet Take 1 tablet by mouth every 4 (four) hours as needed for moderate pain. Patient not taking: Reported on 05/29/2019 05/16/19   Jene Every, MD  HYDROcodone-acetaminophen (NORCO/VICODIN) 5-325 MG tablet Take 1-2 tablets by mouth every 6 (six) hours as needed for moderate pain. 05/29/19   Vanna Scotland, MD  naproxen (NAPROSYN) 500 MG tablet Take 1 tablet (500 mg total) by mouth 2 (two) times daily with a meal.  05/16/19   Lavonia Drafts, MD  ondansetron (ZOFRAN ODT) 4 MG disintegrating tablet Take 1 tablet (4 mg total) by mouth every 8 (eight) hours as needed. 05/16/19   Lavonia Drafts, MD  predniSONE (DELTASONE) 20 MG tablet Take 3 tablets (60 mg total) by mouth daily for 4 days. 10/16/19 10/20/19  Rudene Re, MD  sulfamethoxazole-trimethoprim (BACTRIM DS) 800-160 MG tablet Take  2 tablets by mouth 2 (two) times daily for 7 days. 10/18/19 10/25/19  Hinda Kehr, MD  tamsulosin (FLOMAX) 0.4 MG CAPS capsule Take 1 capsule (0.4 mg total) by mouth daily. 05/16/19   Lavonia Drafts, MD    Allergies Ativan [lorazepam], Benadryl [diphenhydramine hcl (sleep)], Iodine, and Latex  No family history on file.  Social History Social History   Tobacco Use  . Smoking status: Former Research scientist (life sciences)  . Smokeless tobacco: Never Used  Substance Use Topics  . Alcohol use: No  . Drug use: Never    Review of Systems Constitutional: No fever/chills Respiratory: Denies shortness of breath. Gastrointestinal: No abdominal pain.   Musculoskeletal: Swelling and redness of left ring finger around the nail.  Integumentary: Redness and swelling as listed above in MSK section. Neurological: Negative for headaches, focal weakness or numbness.   ____________________________________________   PHYSICAL EXAM:  VITAL SIGNS: ED Triage Vitals  Enc Vitals Group     BP 10/18/19 0147 (!) 150/117     Pulse Rate 10/18/19 0147 (!) 120     Resp 10/18/19 0147 18     Temp 10/18/19 0147 99.1 F (37.3 C)     Temp Source 10/18/19 0147 Oral     SpO2 10/18/19 0147 98 %     Weight 10/18/19 0149 75.8 kg (167 lb)     Height 10/18/19 0149 1.6 m (5\' 3" )     Head Circumference --      Peak Flow --      Pain Score 10/18/19 0149 5     Pain Loc --      Pain Edu? --      Excl. in [place]? --     Constitutional: Alert and oriented.  Eyes: Conjunctivae are normal.  Head: Atraumatic. Cardiovascular: Normal rate, regular rhythm. Good peripheral circulation. Musculoskeletal: The patient has erythema and some fluctuance as well as some blanching around the nailbed of his left ring finger .  His nails are trimmed very short.  No evidence of fracture or dislocation.  No fusiform swelling, no tenderness to palpation along the tendon sheath, no difficulty flexing and extending the finger.  Presentation consistent with  paronychia. Neurologic:  Normal speech and language. No gross focal neurologic deficits are appreciated.  Skin: See MSK section above. Psychiatric: Mood and affect are normal. Speech and behavior are normal.  ____________________________________________   LABS (all labs ordered are listed, but only abnormal results are displayed)  Labs Reviewed - No data to display ____________________________________________  EKG  No indication for emergent EKG ____________________________________________  RADIOLOGY I, Hinda Kehr, personally viewed and evaluated these images (plain radiographs) as part of my medical decision making, as well as reviewing the written report by the radiologist.  ED MD interpretation: No indication for emergent imaging  Official radiology report(s): No results found.  ____________________________________________   PROCEDURES   Procedure(s) performed (including Critical Care):  Marland KitchenMarland KitchenIncision and Drainage  Date/Time: 10/18/2019 4:52 AM Performed by: Hinda Kehr, MD Authorized by: Hinda Kehr, MD   Consent:    Consent obtained:  Verbal   Consent given by:  Patient   Risks  discussed:  Bleeding, infection, incomplete drainage and pain   Alternatives discussed:  Alternative treatment, delayed treatment and observation Location:    Type:  Abscess (paronychia)   Location:  Upper extremity   Upper extremity location:  Finger   Finger location:  L ring finger Pre-procedure details:    Skin preparation:  Betadine Anesthesia (see MAR for exact dosages):    Anesthesia method:  Nerve block   Block location:  Middle phalanx of left ring finger   Block anesthetic:  Lidocaine 1% w/o epi   Block injection procedure:  Anatomic landmarks identified, anatomic landmarks palpated and negative aspiration for blood   Block outcome:  Anesthesia achieved Procedure type:    Complexity:  Simple Procedure details:    Incision types:  Stab incision   Scalpel blade:  11    Wound management:  Probed and deloculated   Drainage:  Bloody and purulent   Drainage amount:  Moderate   Wound treatment:  Wound left open   Packing materials:  None Post-procedure details:    Patient tolerance of procedure:  Tolerated well, no immediate complications     ____________________________________________   INITIAL IMPRESSION / MDM / ASSESSMENT AND PLAN / ED COURSE  As part of my medical decision making, I reviewed the following data within the electronic MEDICAL RECORD NUMBER Nursing notes reviewed and incorporated, Labs reviewed , Old chart reviewed and Notes from prior ED visits   Patient has a paronychia on the ring finger of his left hand.  No evidence of flexor tenosynovitis or other more potentially dangerous infection.  After discussing the risks and benefits and obtaining verbal consent, I performed a digital block with good results and performed an incision and drainage of the paronychia with moderate purulent discharge followed by blood.  I went over the management recommendations including antibiotic ointment twice daily, frequent warm water soaks, and completing the weeklong course of antibiotics that I prescribed as listed below.  I gave my usual and customary return precautions and provided him with the name and number of a hand specialist with whom he can follow-up as well.          ____________________________________________  FINAL CLINICAL IMPRESSION(S) / ED DIAGNOSES  Final diagnoses:  Paronychia of left ring finger     MEDICATIONS GIVEN DURING THIS VISIT:  Medications  lidocaine (PF) (XYLOCAINE) 1 % injection 5 mL (has no administration in time range)  sulfamethoxazole-trimethoprim (BACTRIM DS) 800-160 MG per tablet 2 tablet (has no administration in time range)     ED Discharge Orders         Ordered    sulfamethoxazole-trimethoprim (BACTRIM DS) 800-160 MG tablet  2 times daily     10/18/19 0451          *Please note:  Timothy Mullins was  evaluated in Emergency Department on 10/18/2019 for the symptoms described in the history of present illness. He was evaluated in the context of the global COVID-19 pandemic, which necessitated consideration that the patient might be at risk for infection with the SARS-CoV-2 virus that causes COVID-19. Institutional protocols and algorithms that pertain to the evaluation of patients at risk for COVID-19 are in a state of rapid change based on information released by regulatory bodies including the CDC and federal and state organizations. These policies and algorithms were followed during the patient's care in the ED.  Some ED evaluations and interventions may be delayed as a result of limited staffing during the pandemic.*  Note:  This document  was prepared using Conservation officer, historic buildings and may include unintentional dictation errors.   Loleta Rose, MD 10/18/19 0500

## 2019-10-18 NOTE — ED Triage Notes (Addendum)
Patient with redness and swelling below the nail of his left fourth finger that started today.

## 2020-10-01 ENCOUNTER — Other Ambulatory Visit: Payer: Self-pay

## 2020-10-01 ENCOUNTER — Emergency Department
Admission: EM | Admit: 2020-10-01 | Discharge: 2020-10-01 | Disposition: A | Discharge status: Home / Self Care | Payer: Non-veteran care | Attending: Emergency Medicine | Admitting: Emergency Medicine

## 2020-10-01 ENCOUNTER — Encounter: Payer: Self-pay | Admitting: Emergency Medicine

## 2020-10-01 ENCOUNTER — Emergency Department: Payer: Non-veteran care

## 2020-10-01 DIAGNOSIS — R0789 Other chest pain: Secondary | ICD-10-CM | POA: Diagnosis present

## 2020-10-01 DIAGNOSIS — J40 Bronchitis, not specified as acute or chronic: Secondary | ICD-10-CM | POA: Insufficient documentation

## 2020-10-01 DIAGNOSIS — Z87891 Personal history of nicotine dependence: Secondary | ICD-10-CM | POA: Insufficient documentation

## 2020-10-01 DIAGNOSIS — Z91040 Latex allergy status: Secondary | ICD-10-CM | POA: Insufficient documentation

## 2020-10-01 DIAGNOSIS — Z7952 Long term (current) use of systemic steroids: Secondary | ICD-10-CM | POA: Diagnosis not present

## 2020-10-01 LAB — CBC
HCT: 46.5 % (ref 39.0–52.0)
Hemoglobin: 16 g/dL (ref 13.0–17.0)
MCH: 29.4 pg (ref 26.0–34.0)
MCHC: 34.4 g/dL (ref 30.0–36.0)
MCV: 85.3 fL (ref 80.0–100.0)
Platelets: 277 10*3/uL (ref 150–400)
RBC: 5.45 MIL/uL (ref 4.22–5.81)
RDW: 13.1 % (ref 11.5–15.5)
WBC: 6.8 10*3/uL (ref 4.0–10.5)
nRBC: 0 % (ref 0.0–0.2)

## 2020-10-01 LAB — BASIC METABOLIC PANEL
Anion gap: 12 (ref 5–15)
BUN: 11 mg/dL (ref 6–20)
CO2: 21 mmol/L — ABNORMAL LOW (ref 22–32)
Calcium: 9.3 mg/dL (ref 8.9–10.3)
Chloride: 104 mmol/L (ref 98–111)
Creatinine, Ser: 0.95 mg/dL (ref 0.61–1.24)
GFR, Estimated: >60 mL/min (ref >60)
Glucose, Bld: 104 mg/dL — ABNORMAL HIGH (ref 70–99)
Potassium: 4 mmol/L (ref 3.5–5.1)
Sodium: 137 mmol/L (ref 135–145)

## 2020-10-01 LAB — TROPONIN I (HIGH SENSITIVITY): Troponin I (High Sensitivity): 5 ng/L (ref <18)

## 2020-10-01 MED ORDER — AZITHROMYCIN 250 MG PO TABS: ORAL_TABLET | ORAL | 0 refills | Status: AC

## 2020-10-01 MED ORDER — ALBUTEROL SULFATE HFA 108 (90 BASE) MCG/ACT IN AERS: 2.0000 | INHALATION_SPRAY | Freq: Four times a day (QID) | RESPIRATORY_TRACT | 2 refills | Status: DC | PRN

## 2020-10-01 NOTE — ED Triage Notes (Signed)
Presents with chest pain since yesterday  States pain is worse today  Describes as pressure

## 2020-10-01 NOTE — ED Provider Notes (Signed)
[facility] Emergency Department Provider Note   ____________________________________________    I have reviewed the triage vital signs and the nursing notes.   HISTORY  Chief Complaint Chest Pain (/)     HPI Timothy Mullins is a 45 y.o. male who presents with complaints of cough, mild chest discomfort, fatigue.  Patient reports that typically when he gets bronchitis he gets chest discomfort.  He states his symptoms today are consistent with bronchitis and he is requesting treatment.  Denies shortness of breath.  Does not take anything for this.  Does not think he had fevers no myalgias.  No pleurisy  Past Medical History:  Diagnosis Date  . Asthma   . Hydrocephalus (HCC)   . Neurogenic bladder   . Renal disorder   . Seizures (HCC)   . Spina bifida [facility])     Patient Active Problem List   Diagnosis Date Noted  . Urinary retention with incomplete bladder emptying 05/21/2019  . GERD (gastroesophageal reflux disease) 05/21/2019  . 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 Depression 05/21/2019  . Urinary retention 04/15/2019  . Septic shock (HCC) 04/15/2019  . Seizure disorder (HCC) 04/15/2019  . Neuromuscular dysfunction of bladder, unspecified 04/15/2019  . Bacteremia due to Gram-negative bacteria 04/15/2019  . Sepsis (HCC) 04/14/2019  . Pyelonephritis, acute 07/05/2017  . Nephrolithiasis 07/24/2016  . OSA on CPAP 01/25/2016  . Obesity (BMI 30-39.9) 01/25/2016  . 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 S/P ventriculoperitoneal shunt 06/09/2015  . Vasovagal syncope 10/18/2014  . Tobacco dependence 10/07/2014  . Chronic headaches 03/11/2014  . Migraines 08/19/2012  . Spina bifida with hydrocephalus (HCC) 06/03/2012  . Hereditary and idiopathic peripheral neuropathy 06/03/2012    Past Surgical History:  Procedure Laterality Date  . CHOLECYSTECTOMY    . EXTRACORPOREAL SHOCK WAVE LITHOTRIPSY Left 05/29/2019   Procedure:  EXTRACORPOREAL SHOCK WAVE LITHOTRIPSY (ESWL);  Surgeon: Vanna Scotland, MD;  Location: [facility] ORS;  Service: Urology;  Laterality: Left;  Marland Kitchen VENTRICULOPERITONEAL SHUNT      Prior to Admission medications   Medication Sig Start Date End Date Taking? Authorizing Provider  albuterol (VENTOLIN HFA) 108 (90 Base) MCG/ACT inhaler Inhale 2 puffs into the lungs every 6 (six) hours as needed for wheezing or shortness of breath. 10/01/20  Yes Jene Every, MD  azithromycin (ZITHROMAX Z-PAK) 250 MG tablet Take 2 tablets (500 mg) on  Day 1,  followed by 1 tablet (250 mg) once daily on Days 2 through 5. 10/01/20 10/06/20 Yes Jene Every, MD  budesonide-formoterol (SYMBICORT) 80-4.5 MCG/ACT inhaler Inhale 2 puffs into the lungs 2 (two) times daily.   Yes [provider]  fluticasone (FLONASE) 50 MCG/ACT nasal spray Place 2 sprays into both nostrils daily. 11/03/17 11/03/18 Yes Enid Derry, PA-C  HYDROcodone-acetaminophen (NORCO/VICODIN) 5-325 MG tablet Take 1 tablet by mouth every 4 (four) hours as needed for moderate pain. 05/16/19  Yes Jene Every, MD  lamoTRIgine (LAMICTAL) 200 MG tablet Take 200 mg by mouth 2 (two) times daily.   Yes [provider]  oxybutynin (DITROPAN-XL) 5 MG 24 hr tablet Take 5 mg by mouth at bedtime.   Yes [provider]  topiramate (TOPAMAX) 50 MG tablet Take 50 mg by mouth 2 (two) times daily.   Yes [provider]  brompheniramine-pseudoephedrine-DM 30-2-10 MG/5ML syrup Take 2.5 mLs by mouth 4 (four) times daily as needed. 11/03/17   Enid Derry, PA-C  ciprofloxacin (CIPRO) 250 MG tablet Take 1 tablet (250 mg total) by  mouth 2 (two) times daily. Patient not taking: No sig reported 05/26/19   Jerilee Field, MD  docusate sodium (COLACE) 100 MG capsule Take 1 capsule (100 mg total) by mouth 2 (two) times daily. 05/29/19   Vanna Scotland, MD  HYDROcodone-acetaminophen (NORCO/VICODIN) 5-325 MG tablet Take 1-2 tablets by mouth every 6 (six)  hours as needed for moderate pain. 05/29/19   Vanna Scotland, MD  naproxen (NAPROSYN) 500 MG tablet Take 1 tablet (500 mg total) by mouth 2 (two) times daily with a meal. 05/16/19   Jene Every, MD  ondansetron (ZOFRAN ODT) 4 MG disintegrating tablet Take 1 tablet (4 mg total) by mouth every 8 (eight) hours as needed. 05/16/19   Jene Every, MD  tamsulosin (FLOMAX) 0.4 MG CAPS capsule Take 1 capsule (0.4 mg total) by mouth daily. 05/16/19   Jene Every, MD     Allergies Ativan [lorazepam], Benadryl [diphenhydramine hcl (sleep)], Iodine, and Latex  No family history on file.  Social History Social History   Tobacco Use  . Smoking status: Former Games developer  . Smokeless tobacco: Never Used  Substance Use Topics  . Alcohol use: No  . Drug use: Never    Review of Systems  Constitutional: No fever/chills Eyes: No visual changes.  ENT: No sore throat. Cardiovascular: As above Respiratory: As above Gastrointestinal: No abdominal pain.  No nausea, no vomiting.   Genitourinary: Negative for dysuria. Musculoskeletal: No myalgias, no calf pain or swelling Skin: Negative for rash. Neurological: Negative for headaches or weakness   ____________________________________________   PHYSICAL EXAM:  VITAL SIGNS: ED Triage Vitals  Enc Vitals Group     BP 10/01/20 1053 102/62     Pulse Rate 10/01/20 1053 (!) 104     Resp 10/01/20 1053 20     Temp 10/01/20 1053 98.1 F (36.7 C)     Temp Source 10/01/20 1053 Oral     SpO2 10/01/20 1053 97 %     Weight 10/01/20 1052 75.8 kg (167 lb 1.7 oz)     Height 10/01/20 1052 1.6 m (5\' 3" )     Head Circumference --      Peak Flow --      Pain Score 10/01/20 1054 7     Pain Loc --      Pain Edu? --      Excl. in [place]? --     Constitutional: Alert and oriented.   Nose: No congestion/rhinnorhea. Mouth/Throat: Mucous membranes are moist.   Neck:  Painless ROM Cardiovascular: Normal rate, regular rhythm. Grossly normal heart sounds.  Good  peripheral circulation. Respiratory: Normal respiratory effort.  No retractions. Lungs CTAB.  No wheezing Gastrointestinal: Soft and nontender. No distention.   Musculoskeletal: No lower extremity tenderness nor edema.  Warm and well perfused Neurologic:  Normal speech and language. No gross focal neurologic deficits are appreciated.  Skin:  Skin is warm, dry and intact. No rash noted. Psychiatric: Mood and affect are normal. Speech and behavior are normal.  ____________________________________________   LABS (all labs ordered are listed, but only abnormal results are displayed)  Labs Reviewed  BASIC METABOLIC PANEL - Abnormal; Notable for the following components:      Result Value   CO2 21 (*)    Glucose, Bld 104 (*)    All other components within normal limits  CBC  TROPONIN I (HIGH SENSITIVITY)   ____________________________________________  EKG  ED ECG REPORT I, 10/03/20, the attending physician, personally viewed and interpreted this ECG.  Date: 10/01/2020  Rhythm: normal sinus rhythm QRS Axis: normal Intervals: normal ST/T Wave abnormalities: normal Narrative Interpretation: no evidence of acute ischemia  ____________________________________________  RADIOLOGY  Chest x-ray reviewed by me, no infiltrate or effusion ____________________________________________   PROCEDURES  Procedure(s) performed: No  Procedures   Critical Care performed: No ____________________________________________   INITIAL IMPRESSION / ASSESSMENT AND PLAN / ED COURSE  Pertinent labs & imaging results that were available during my care of the patient were reviewed by me and considered in my medical decision making (see chart for details).  Patient presents with cough, mild chest discomfort as above, no wheezing on exam.  Lab work is quite reassuring, normal white blood cell count, normal troponin.  Chest x-ray read by me, no infiltrate or effusion.  He feels confident  that he has developed bronchitis which is the cause of his chest pain, this certainly possible.  No significant bronchospasm at this time, will treat supportively.    ____________________________________________   FINAL CLINICAL IMPRESSION(S) / ED DIAGNOSES  Final diagnoses:  Bronchitis  Atypical chest pain        Note:  This document was prepared using Dragon voice recognition software and may include unintentional dictation errors.   Jene Every, MD 10/01/20 2258

## 2020-10-01 NOTE — ED Notes (Signed)
Says dry cough since yesterday with feeling weak.  No fever, does have a headache.  Says chest hurts when he coughs.

## 2021-07-06 IMAGING — CT CT RENAL STONE PROTOCOL
2 of 4 series · 16 of 46 positions shown, 18 images · non-contrast
Comparison: CT renal stone 05/16/2019

CLINICAL DATA: Left lower abdominal pain/flank pain. Recent renal
stone study with renal stone. Taking opioids. Constipation for 5
days. Eval for SBO. Shunt. Hx of cholecystectomy. Allergic to
iodine.

EXAM:
CT ABDOMEN AND PELVIS WITHOUT CONTRAST
TECHNIQUE: Multidetector CT imaging of the abdomen and pelvis was performed
following the standard protocol without IV contrast.

[Series 2: stone full standard · axial · 0.72mm/px · z∈[-400,+15]mm · 13 of 91 slices shown, 15 images]
[im 4/91  soft-tissue]
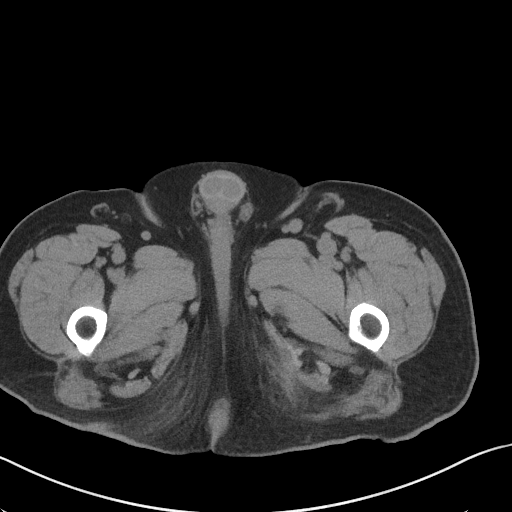
[im 4/91  bone]
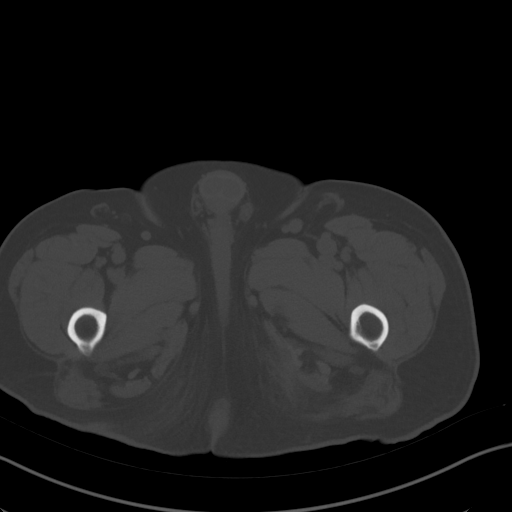
[im 12/91  soft-tissue]
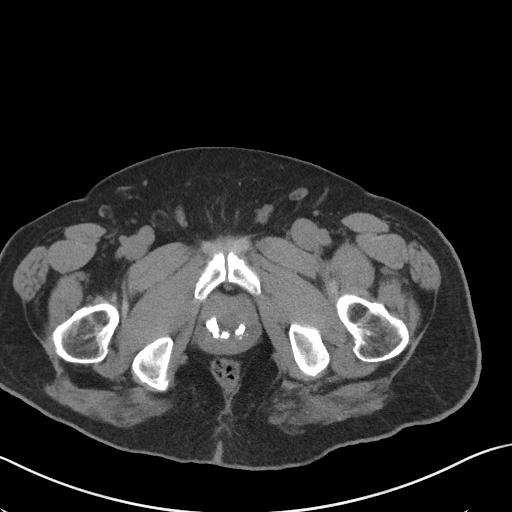
[im 19/91  soft-tissue]
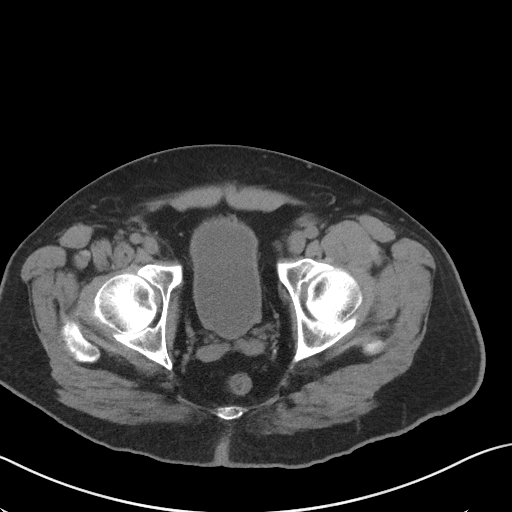
[im 27/91  soft-tissue]
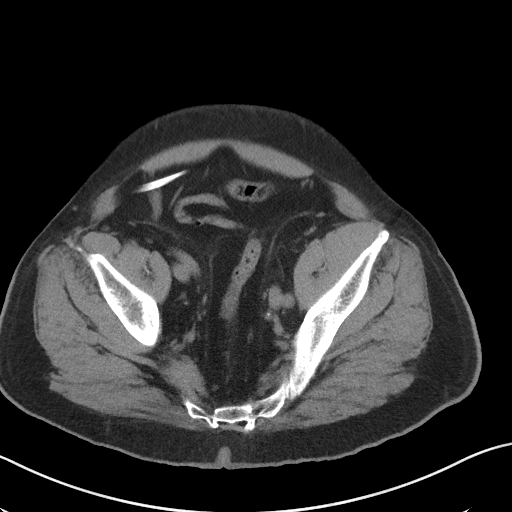
[im 31/91  soft-tissue]
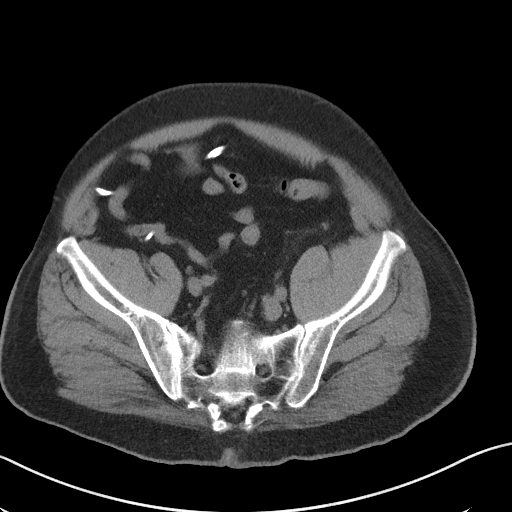
[im 38/91  soft-tissue]
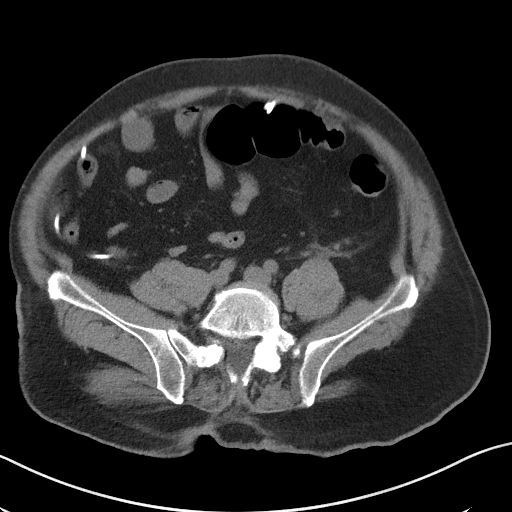
[im 46/91  soft-tissue]
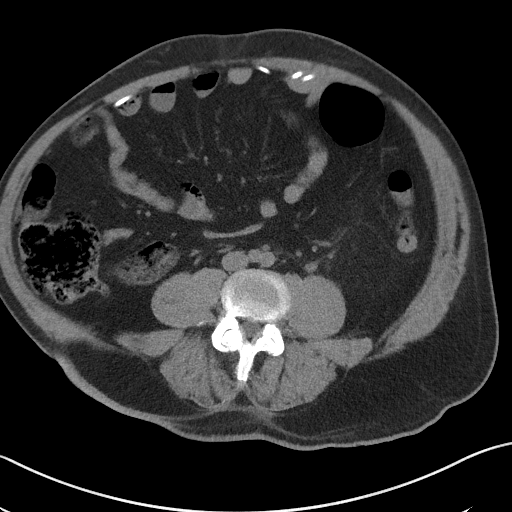
[im 53/91  soft-tissue]
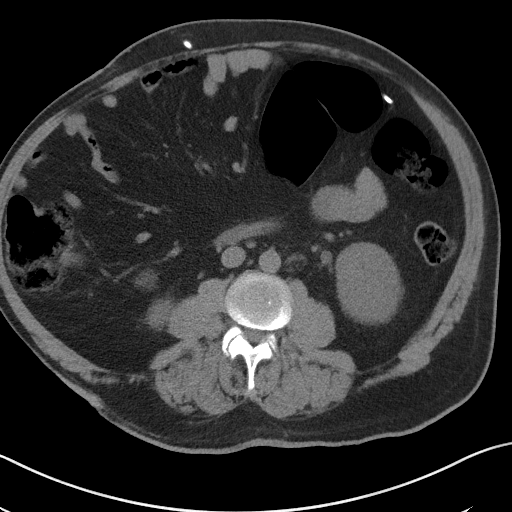
[im 61/91  soft-tissue]
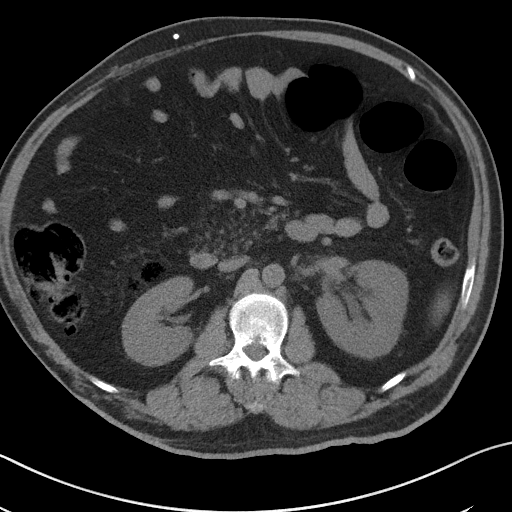
[im 61/91  bone]
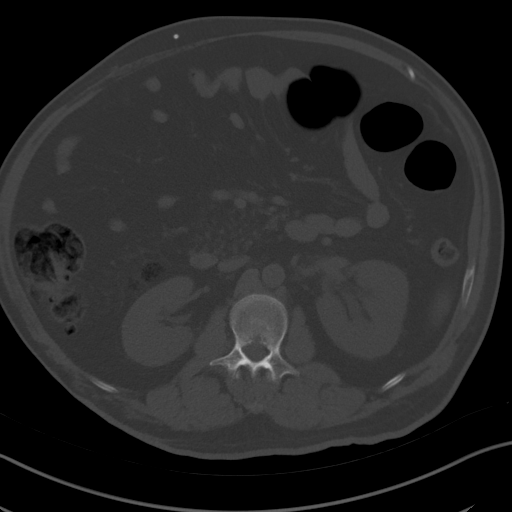
[im 64/91  soft-tissue]
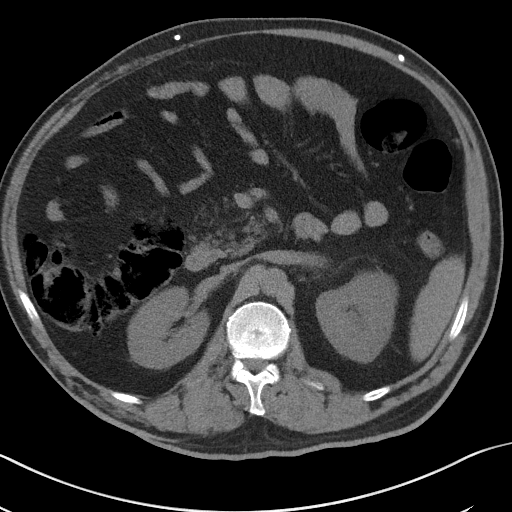
[im 72/91  soft-tissue]
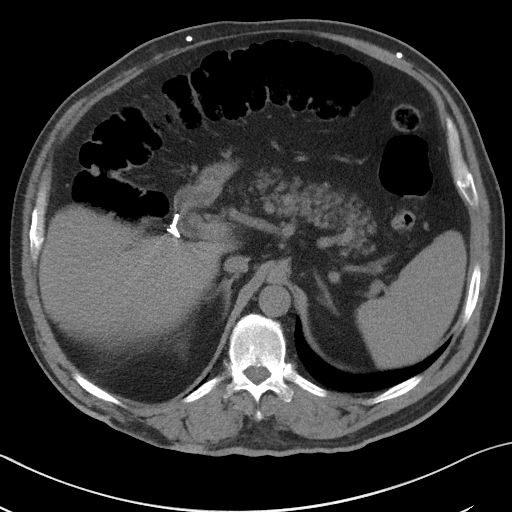
[im 79/91  soft-tissue]
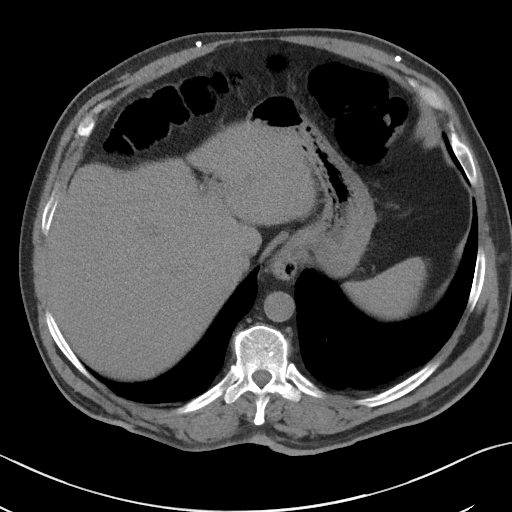
[im 87/91  soft-tissue]
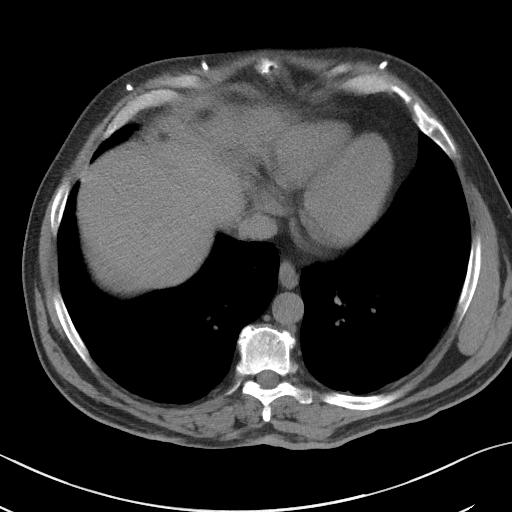

[Series 5: coronal · coronal · 0.78mm/px · 3 of 152 slices shown]
[im 51/152  soft-tissue]
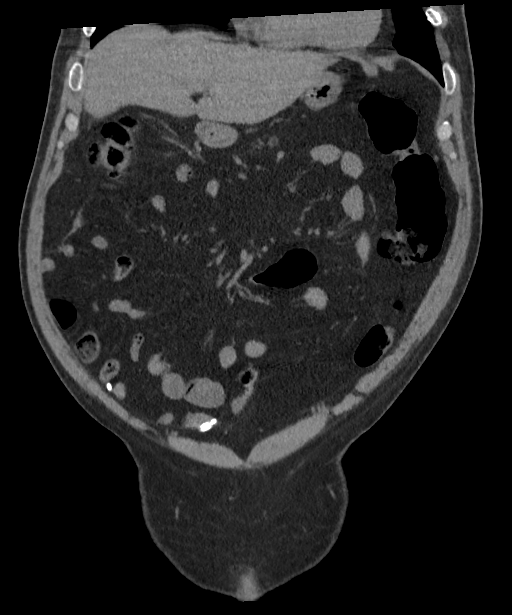
[im 68/152  soft-tissue]
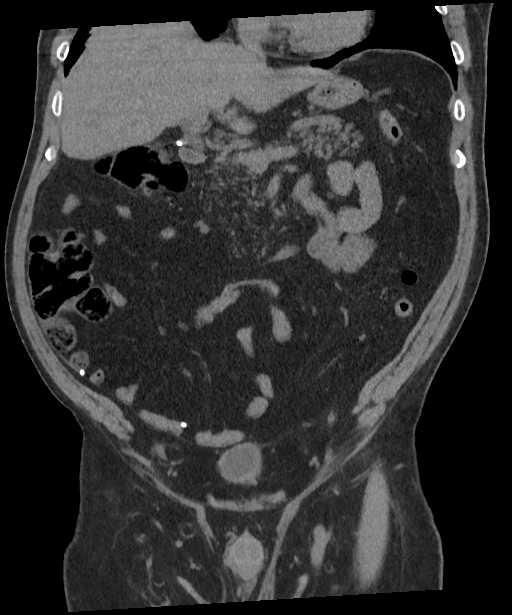
[im 84/152  soft-tissue]
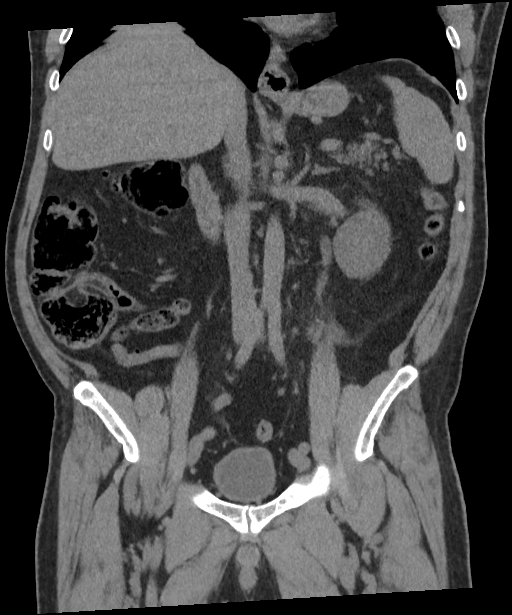

[16 of 46 positions shown; findings below may reference images not displayed]

FINDINGS: Lower chest: Small hiatal hernia. Lung bases are clear.

Hepatobiliary: No focal liver abnormality is seen. No gallstones,
gallbladder wall thickening, or biliary dilatation.

Pancreas: Unremarkable. No pancreatic ductal dilatation or
surrounding inflammatory changes.

Spleen: Normal in size without focal abnormality.

Adrenals/Urinary Tract: Adrenal glands are unremarkable. There is
again a 5 mm obstructing stone in the proximal left ureter with mild
upstream hydronephrosis. There is an additional 4 mm calculus in the
left kidney. Right renal cyst. No right renal calculi identified.

Stomach/Bowel: Stomach is within normal limits. Appendix appears
normal. There is a focally distended loop of colon in the left upper
quadrant measuring 5.6 cm in diameter with smooth tapering on either
side. There are no bowel inflammatory changes. There are no dilated
loops of small bowel.

Vascular/Lymphatic: No significant vascular findings are present. No
enlarged abdominal or pelvic lymph nodes.

Reproductive: Coarse calcifications in the prostate.

Other: No abdominal wall hernia or abnormality. No abdominopelvic
ascites. Shunt catheter in place.

Musculoskeletal: No acute or significant osseous findings.
IMPRESSION: 1. Redemonstrated 5 mm obstructing stone in the proximal left ureter
with mild upstream hydronephrosis.
2. There is a focally distended loop of colon in the left upper
quadrant with smooth tapering on either side. No bowel inflammatory
changes. Findings may represent a focal ileus.

## 2021-07-08 IMAGING — CR DG ABDOMEN 1V
1 series · 3 of 3 positions shown · non-contrast
Comparison: CT of the abdomen May 19, 2019

CLINICAL DATA: Left ureteral stone.

EXAM:
ABDOMEN - 1 VIEW

[Series 1: dg abd 1 view · 0.14mm/px · 3 of 3 slices shown]
[im 1/3]
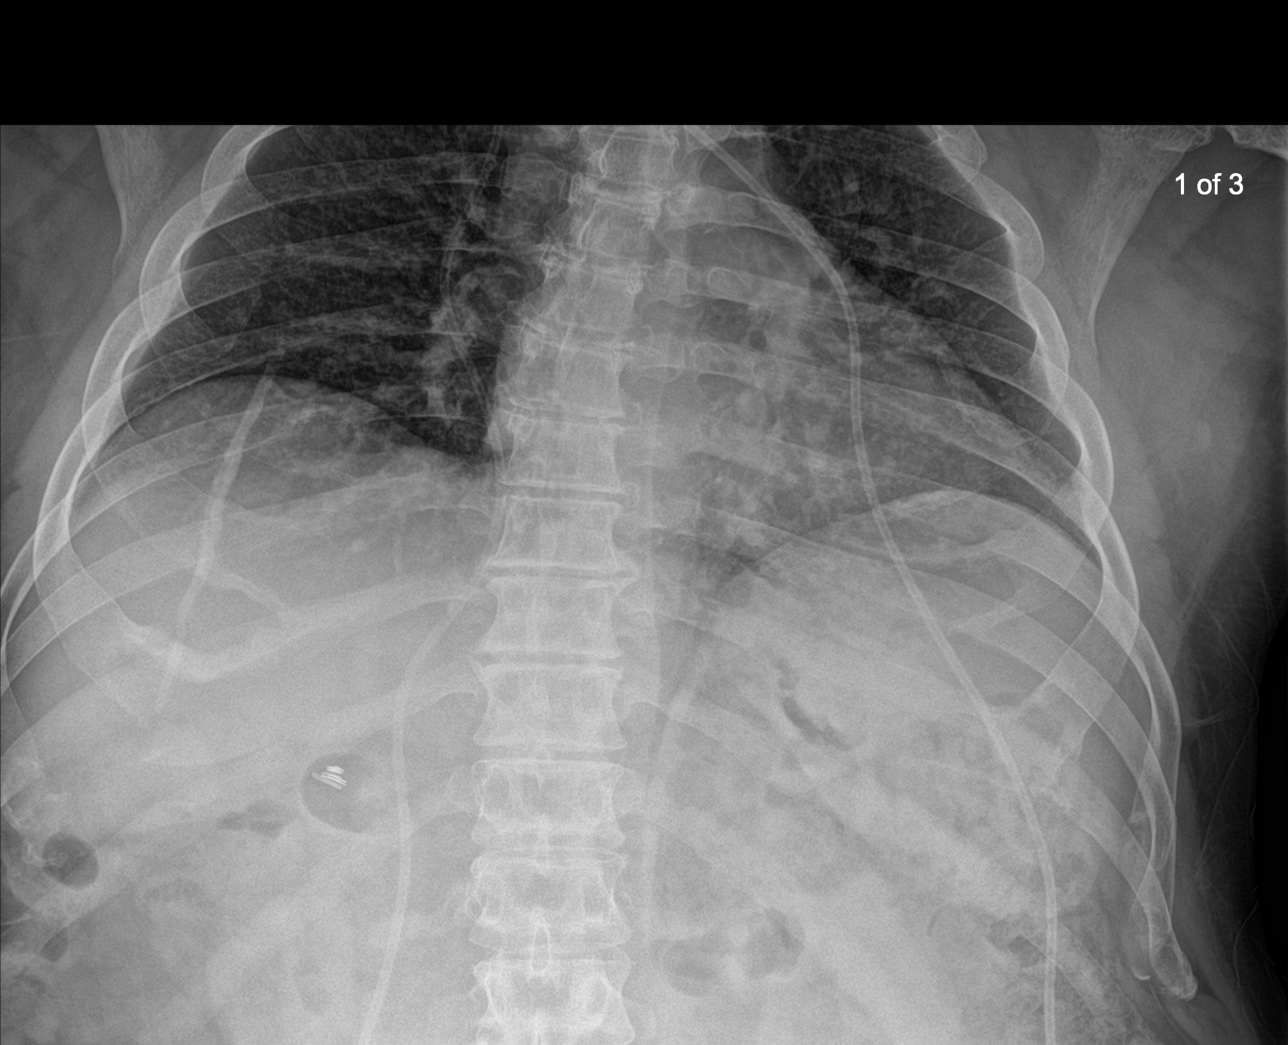
[im 2/3]
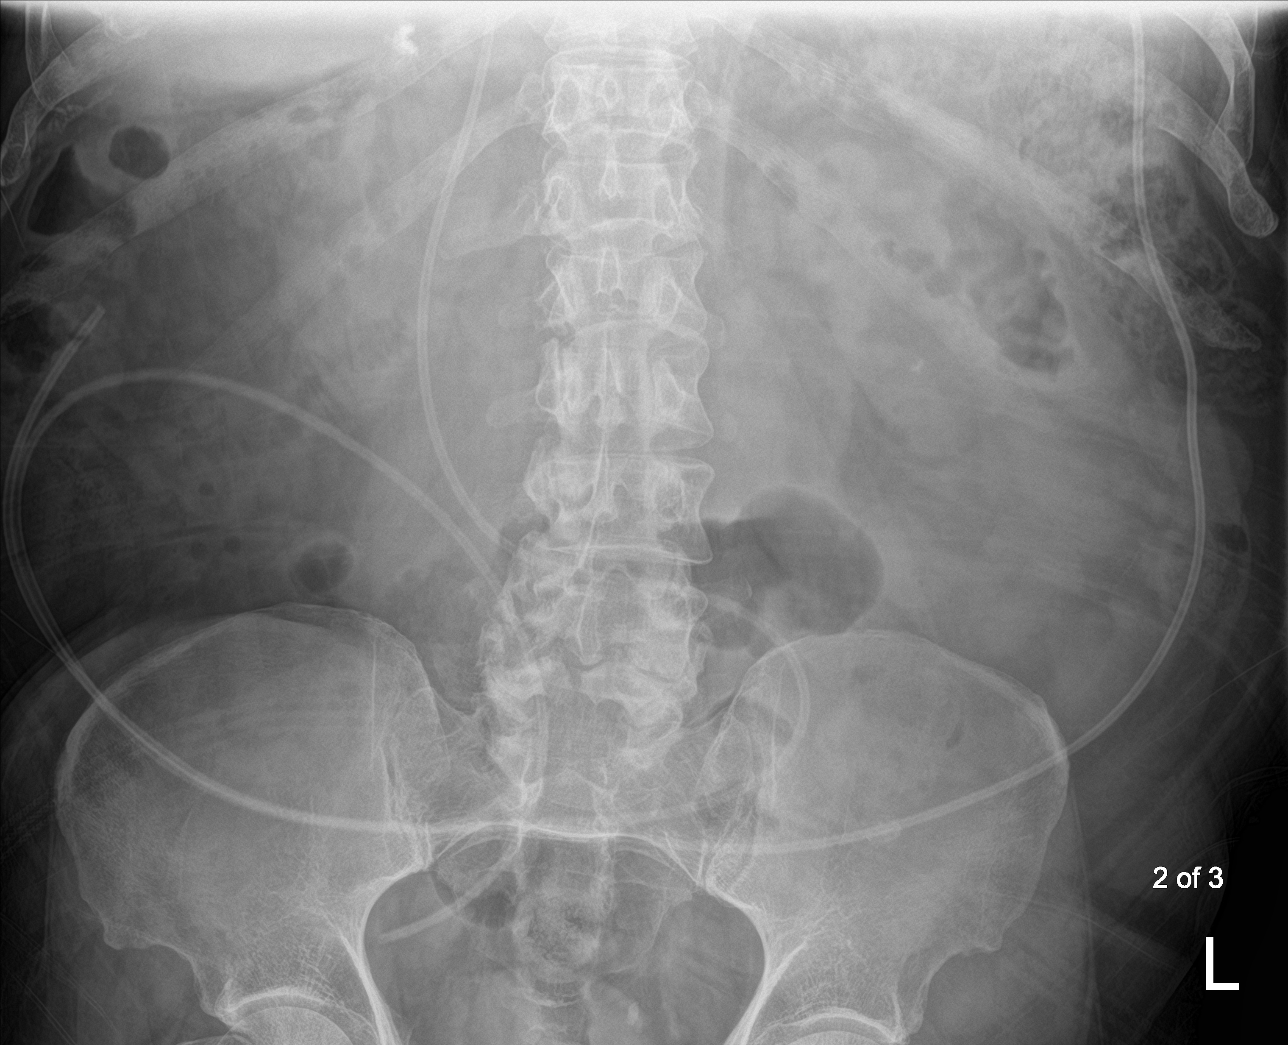
[im 3/3]
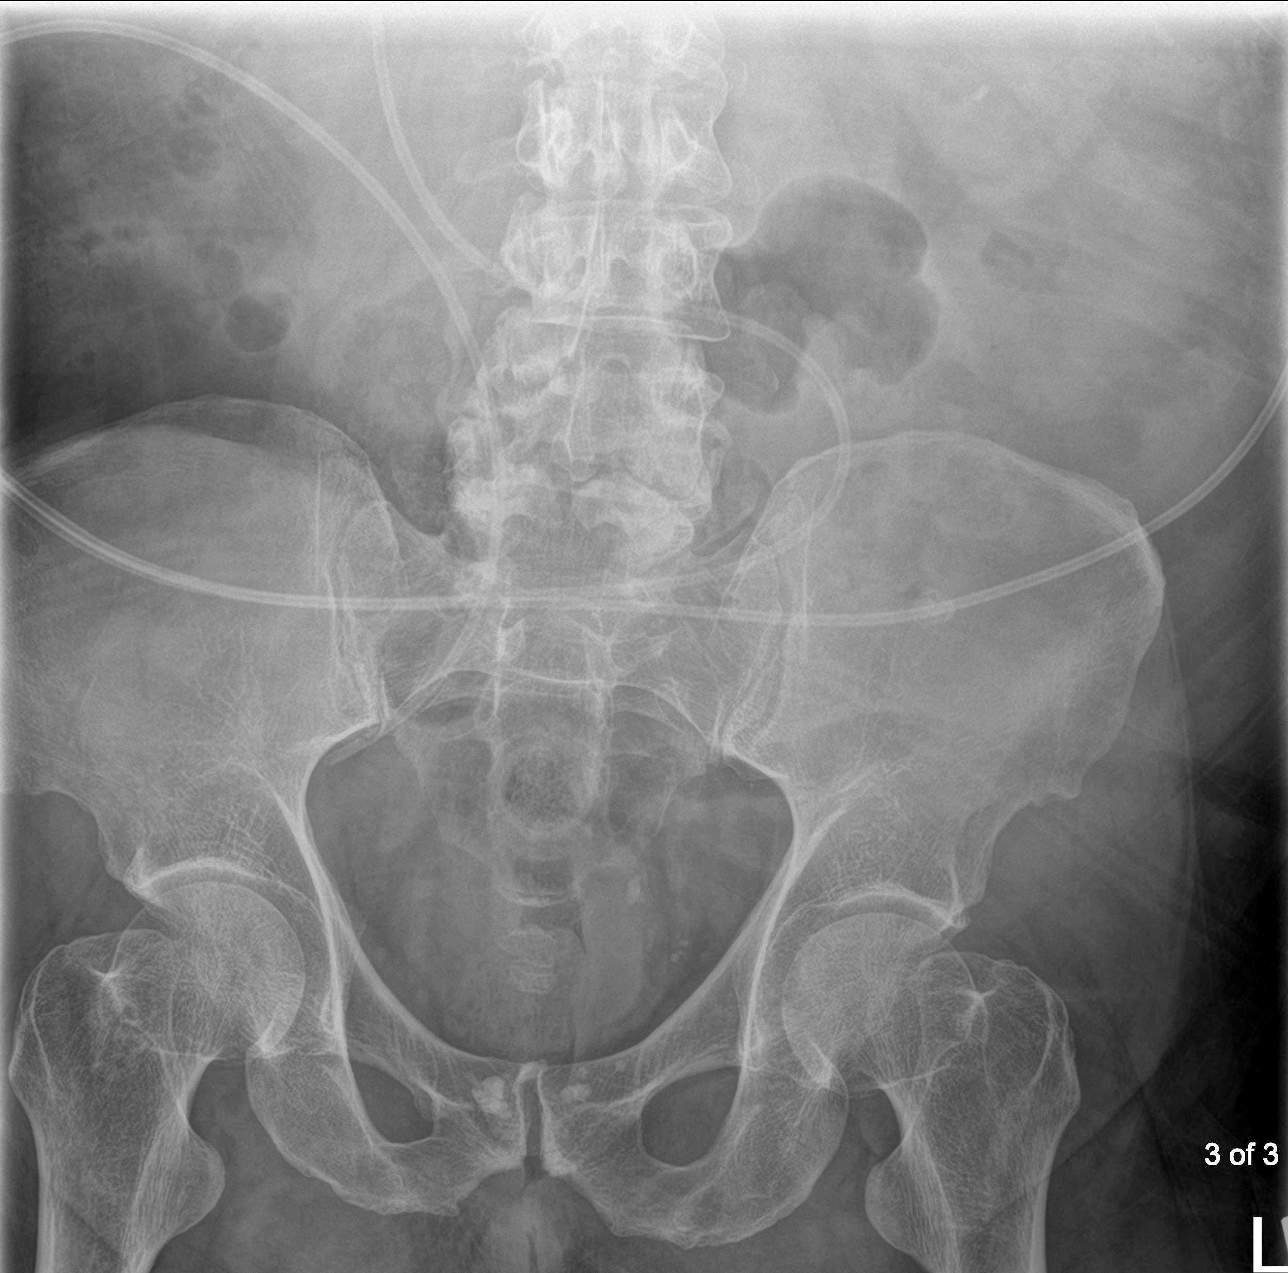

[3 of 3 positions shown; findings below may reference images not displayed]

FINDINGS: The bowel gas pattern is normal. Elongated calcific density in the
left pelvis may represent the previously demonstrated left ureteral
calculus, which may have descended to the vesicoureteral junction.

Additional renal calculi overlie the lower pole of the left kidney.

VP shunt tubing noted.

Soft tissues otherwise normal.
IMPRESSION: 1. Elongated calcific density in the left pelvis may represent the
previously demonstrated left ureteral calculus, which may have
descended to the vesicoureteral junction.
2. Additional renal calculi overlie the lower pole of the left
kidney.

## 2022-09-11 ENCOUNTER — Emergency Department: Payer: Non-veteran care

## 2022-09-11 ENCOUNTER — Other Ambulatory Visit: Payer: Self-pay

## 2022-09-11 ENCOUNTER — Emergency Department
Admission: EM | Admit: 2022-09-11 | Discharge: 2022-09-11 | Disposition: A | Discharge status: Home / Self Care | Payer: Non-veteran care | Attending: Emergency Medicine | Admitting: Emergency Medicine

## 2022-09-11 DIAGNOSIS — Z1152 Encounter for screening for COVID-19: Secondary | ICD-10-CM | POA: Diagnosis not present

## 2022-09-11 DIAGNOSIS — R509 Fever, unspecified: Secondary | ICD-10-CM | POA: Diagnosis present

## 2022-09-11 DIAGNOSIS — J101 Influenza due to other identified influenza virus with other respiratory manifestations: Secondary | ICD-10-CM

## 2022-09-11 DIAGNOSIS — J111 Influenza due to unidentified influenza virus with other respiratory manifestations: Secondary | ICD-10-CM

## 2022-09-11 LAB — RESP PANEL BY RT-PCR (RSV, FLU A&B, COVID)  RVPGX2
Influenza A by PCR: POSITIVE — AB
Influenza B by PCR: NEGATIVE
Resp Syncytial Virus by PCR: NEGATIVE
SARS Coronavirus 2 by RT PCR: NEGATIVE

## 2022-09-11 LAB — GROUP A STREP BY PCR: Group A Strep by PCR: NOT DETECTED

## 2022-09-11 MED ORDER — OSELTAMIVIR PHOSPHATE 75 MG PO CAPS: 75.0000 mg | ORAL_CAPSULE | Freq: Two times a day (BID) | ORAL | 0 refills | Status: AC

## 2022-09-11 MED ORDER — BUDESONIDE-FORMOTEROL FUMARATE 80-4.5 MCG/ACT IN AERO: 2.0000 | INHALATION_SPRAY | Freq: Two times a day (BID) | RESPIRATORY_TRACT | 1 refills | Status: DC

## 2022-09-11 MED ORDER — ALBUTEROL SULFATE HFA 108 (90 BASE) MCG/ACT IN AERS: 2.0000 | INHALATION_SPRAY | Freq: Four times a day (QID) | RESPIRATORY_TRACT | 2 refills | Status: DC | PRN

## 2022-09-11 NOTE — ED Triage Notes (Signed)
Pt reports flu like sx's that hit him this am suddenly. Pt c/o fever, bodyaches, productive cough with pain and reports recent exposure to wife.

## 2022-09-11 NOTE — ED Provider Notes (Signed)
[facility] Provider Note    Event Date/Time   First MD Initiated Contact with Patient 09/11/22 1226     (approximate)   History   Cough, Fever, and Nasal Congestion   HPI  Timothy Mullins is a 46 y.o. male with history of spina bifida, seizures, asthma presents emergency department with fever and flulike symptoms.  States his wife is admitted for influenza.  Has not been on preventative Tamiflu.  Is also out of his inhalers.  No vomiting or diarrhea.  No chest pain/shortness of breath      Physical Exam   Triage Vital Signs: ED Triage Vitals  Enc Vitals Group     BP 09/11/22 1234 (!) 142/88     Pulse Rate 09/11/22 1234 (!) 122     Resp 09/11/22 1234 18     Temp 09/11/22 1234 (!) 100.4 F (38 C)     Temp Source 09/11/22 1234 Oral     SpO2 09/11/22 1234 95 %     Weight 09/11/22 1143 165 lb 5.5 oz (75 kg)     Height 09/11/22 1143 5\' 3"  (1.6 m)     Head Circumference --      Peak Flow --      Pain Score 09/11/22 1143 0     Pain Loc --      Pain Edu? --      Excl. in [place]? --     Most recent vital signs: Vitals:   09/11/22 1234  BP: (!) 142/88  Pulse: (!) 122  Resp: 18  Temp: (!) 100.4 F (38 C)  SpO2: 95%     General: Awake, no distress.   CV:  Good peripheral perfusion.  Tachycardic due to fever, regular rhythm Resp:  Normal effort. Lungs cta Abd:  No distention.   Other:      ED Results / Procedures / Treatments   Labs (all labs ordered are listed, but only abnormal results are displayed) Labs Reviewed  RESP PANEL BY RT-PCR (RSV, FLU A&B, COVID)  RVPGX2 - Abnormal; Notable for the following components:      Result Value   Influenza A by PCR POSITIVE (*)    All other components within normal limits  GROUP A STREP BY PCR     EKG     RADIOLOGY Chest x-ray    PROCEDURES:   Procedures   MEDICATIONS ORDERED IN ED: Medications - No data to display   IMPRESSION / MDM / [place] / ED COURSE  I  reviewed the triage vital signs and the nursing notes.                              Differential diagnosis includes, but is not limited to, COVID, influenza, RSV, acute bronchitis  Patient's presentation is most consistent with acute complicated illness / injury requiring diagnostic workup.   Chest x-ray independently reviewed and interpreted by me as being negative for any acute abnormality  Respiratory panel obtained  Due to the patient's wife having influenza feel the patient has influenza, started him on Tamiflu, will call him with his test results.   Patient is a panel is positive for influenza, strep test reassuring  I did call the patient to give him his results.  Already called in Tamiflu for him.  He is to start taking this medication, Tylenol and ibuprofen for fever as needed, follow-up with his regular doctor if not  improving 3 to 4 days, return emergency department worsening.  Drink plenty of fluids.  He is in agreement treatment plan.  Stable condition at discharge.   FINAL CLINICAL IMPRESSION(S) / ED DIAGNOSES   Final diagnoses:  Influenza-like illness  Influenza A     Rx / DC Orders   ED Discharge Orders          Ordered    oseltamivir (TAMIFLU) 75 MG capsule  2 times daily        09/11/22 1227    albuterol (VENTOLIN HFA) 108 (90 Base) MCG/ACT inhaler  Every 6 hours PRN        09/11/22 1229    budesonide-formoterol (SYMBICORT) 80-4.5 MCG/ACT inhaler  2 times daily        09/11/22 1229             Note:  This document was prepared using Dragon voice recognition software and may include unintentional dictation errors.    Versie Starks, PA-C 09/11/22 1348    Nathaniel Man, MD 09/11/22 [phone]

## 2022-09-11 NOTE — ED Notes (Signed)
Pt verbalizes understanding of d/c instructions, medications and follow up 

## 2023-02-02 ENCOUNTER — Other Ambulatory Visit: Payer: Self-pay

## 2023-02-02 ENCOUNTER — Emergency Department
Admission: EM | Admit: 2023-02-02 | Discharge: 2023-02-02 | Disposition: A | Discharge status: Home / Self Care | Payer: Non-veteran care | Attending: Emergency Medicine | Admitting: Emergency Medicine

## 2023-02-02 DIAGNOSIS — R300 Dysuria: Secondary | ICD-10-CM | POA: Diagnosis present

## 2023-02-02 DIAGNOSIS — R310 Gross hematuria: Secondary | ICD-10-CM | POA: Diagnosis not present

## 2023-02-02 DIAGNOSIS — N390 Urinary tract infection, site not specified: Secondary | ICD-10-CM | POA: Insufficient documentation

## 2023-02-02 LAB — URINALYSIS, ROUTINE W REFLEX MICROSCOPIC
Bacteria, UA: NONE SEEN
RBC / HPF: >50 RBC/hpf (ref 0–5)
Specific Gravity, Urine: 1.04 — ABNORMAL HIGH (ref 1.005–1.030)
Squamous Epithelial / HPF: NONE SEEN /HPF (ref 0–5)
WBC, UA: >50 WBC/hpf (ref 0–5)

## 2023-02-02 MED ORDER — CIPROFLOXACIN HCL 500 MG PO TABS: 500.0000 mg | ORAL_TABLET | Freq: Two times a day (BID) | ORAL | 0 refills | Status: AC

## 2023-02-02 MED ORDER — CEFTRIAXONE SODIUM 1 G IJ SOLR
1.0000 g | Freq: Once | INTRAMUSCULAR | Status: AC
  Administered 2023-02-02: 1 g via INTRAMUSCULAR
  Filled 2023-02-02: qty 10

## 2023-02-02 NOTE — ED Notes (Signed)
Assessment complete. Pt states self caths 4x a day. Pt states he has been "washing" the self caths and reusing them. Pt informed they are for single use only and to be discarded after every use that this will cause a urine infection. Pt states he was not aware. PA Christiane Ha notified.

## 2023-02-02 NOTE — ED Triage Notes (Signed)
Pt to ED for dark red blood in urine since yesterday. No clots. No flank pain or dysuria. Went to [facility], told to come here. Told by MD there maybe has "bad UTI". Pt also endorses aching and pressure to bladder area since yesterday.

## 2023-02-02 NOTE — ED Provider Notes (Signed)
[facility] Provider Note  Patient Contact: 5:13 PM (approximate)   History   Hematuria   HPI  Timothy Mullins is a 47 y.o. male who presents the emergency department complaining of dysuria, hematuria.  Patient has GU self catheterizes daily for urine output.  He states that he typically has to catheterize himself 3-4 times a day.  It appears that patient has not been changing his catheters.  He has been reusing the same line multiple times.  He started with some dysuria, polyuria type symptoms with increased frequency of urgency.  Patient states that he has then developed some hematuria.  Patient was referred to the ED for evaluation.  No flank pain, abdominal pain, fevers, chills.     Physical Exam   Triage Vital Signs: ED Triage Vitals  Enc Vitals Group     BP 02/02/23 1510 (!) 139/100     Pulse Rate 02/02/23 1510 (!) 101     Resp 02/02/23 1510 16     Temp 02/02/23 1510 98.3 F (36.8 C)     Temp Source 02/02/23 1510 Oral     SpO2 02/02/23 1510 96 %     Weight 02/02/23 1510 173 lb (78.5 kg)     Height 02/02/23 1510 5\' 2"  (1.575 m)     Head Circumference --      Peak Flow --      Pain Score 02/02/23 1507 3     Pain Loc --      Pain Edu? --      Excl. in [place]? --     Most recent vital signs: Vitals:   02/02/23 1510  BP: (!) 139/100  Pulse: (!) 101  Resp: 16  Temp: 98.3 F (36.8 C)  SpO2: 96%     General: Alert and in no acute distress.  Cardiovascular:  Good peripheral perfusion Respiratory: Normal respiratory effort without tachypnea or retractions. Lungs CTAB.  Gastrointestinal: Bowel sounds 4 quadrants. Soft and nontender to palpation. No guarding or rigidity. No palpable masses. No distention. No CVA tenderness. Musculoskeletal: Full range of motion to all extremities.  Neurologic:  No gross focal neurologic deficits are appreciated.  Skin:   No rash noted Other:   ED Results / Procedures / Treatments   Labs (all labs ordered  are listed, but only abnormal results are displayed) Labs Reviewed  URINALYSIS, ROUTINE W REFLEX MICROSCOPIC - Abnormal; Notable for the following components:      Result Value   Color, Urine RED (*)    APPearance CLOUDY (*)    Specific Gravity, Urine 1.040 (*)    Glucose, UA   (*)    Value: TEST NOT REPORTED DUE TO COLOR INTERFERENCE OF URINE PIGMENT   Hgb urine dipstick   (*)    Value: TEST NOT REPORTED DUE TO COLOR INTERFERENCE OF URINE PIGMENT   Bilirubin Urine   (*)    Value: TEST NOT REPORTED DUE TO COLOR INTERFERENCE OF URINE PIGMENT   Ketones, ur   (*)    Value: TEST NOT REPORTED DUE TO COLOR INTERFERENCE OF URINE PIGMENT   Protein, ur   (*)    Value: TEST NOT REPORTED DUE TO COLOR INTERFERENCE OF URINE PIGMENT   Nitrite   (*)    Value: TEST NOT REPORTED DUE TO COLOR INTERFERENCE OF URINE PIGMENT   Leukocytes,Ua   (*)    Value: TEST NOT REPORTED DUE TO COLOR INTERFERENCE OF URINE PIGMENT   All other components within normal limits  EKG     RADIOLOGY    No results found.  PROCEDURES:  Critical Care performed: No  Procedures   MEDICATIONS ORDERED IN ED: Medications  cefTRIAXone (ROCEPHIN) injection 1 g (1 g Intramuscular Given 02/02/23 1715)     IMPRESSION / MDM / ASSESSMENT AND PLAN / ED COURSE  I reviewed the triage vital signs and the nursing notes.                                 Differential diagnosis includes, but is not limited to, cystitis, UTI, nephrolithiasis, pyelonephritis   Patient's presentation is most consistent with acute presentation with potential threat to life or bodily function.   Patient's diagnosis is consistent with UTI, hematuria.  Patient presents emergency department with increased urgency, hematuria.  Patient has to self catheterize to have urinary output.  He has had an increased sense of urgency, has developed some hematuria as well.  Gross hematuria is obscuring nitrates or white blood cell findings.  Given the  symptoms prior to the onset of hematuria suspect UTI.  Will treat at this time with Rocephin, antibiotics.  Urine will be cultured.  Patient has no abdominal pain, flank pain.  No indication for other labs or imaging currently.  Return precautions discussed with the patient.  Follow-up primary care as needed.. Patient is given ED precautions to return to the ED for any worsening or new symptoms.     FINAL CLINICAL IMPRESSION(S) / ED DIAGNOSES   Final diagnoses:  Lower urinary tract infectious disease  Gross hematuria     Rx / DC Orders   ED Discharge Orders          Ordered    ciprofloxacin (CIPRO) 500 MG tablet  2 times daily        02/02/23 1716             Note:  This document was prepared using Dragon voice recognition software and may include unintentional dictation errors.   Lanette Hampshire 02/02/23 1726    Chesley Noon, MD 02/02/23 Windell Moment

## 2023-03-18 ENCOUNTER — Emergency Department: Payer: Non-veteran care

## 2023-03-18 ENCOUNTER — Emergency Department
Admission: EM | Admit: 2023-03-18 | Discharge: 2023-03-18 | Disposition: A | Discharge status: Home / Self Care | Payer: Non-veteran care | Attending: Emergency Medicine | Admitting: Emergency Medicine

## 2023-03-18 ENCOUNTER — Other Ambulatory Visit: Payer: Self-pay

## 2023-03-18 DIAGNOSIS — N390 Urinary tract infection, site not specified: Secondary | ICD-10-CM | POA: Diagnosis not present

## 2023-03-18 DIAGNOSIS — R109 Unspecified abdominal pain: Secondary | ICD-10-CM | POA: Diagnosis present

## 2023-03-18 LAB — URINALYSIS, ROUTINE W REFLEX MICROSCOPIC
Bilirubin Urine: NEGATIVE
Glucose, UA: NEGATIVE mg/dL
Ketones, ur: NEGATIVE mg/dL
Nitrite: NEGATIVE
Protein, ur: 30 mg/dL — AB
Specific Gravity, Urine: 1.014 (ref 1.005–1.030)
WBC, UA: >50 WBC/hpf (ref 0–5)
pH: 6 (ref 5.0–8.0)

## 2023-03-18 LAB — BASIC METABOLIC PANEL
Anion gap: 8 (ref 5–15)
BUN: 15 mg/dL (ref 6–20)
CO2: 19 mmol/L — ABNORMAL LOW (ref 22–32)
Calcium: 8.8 mg/dL — ABNORMAL LOW (ref 8.9–10.3)
Chloride: 110 mmol/L (ref 98–111)
Creatinine, Ser: 0.81 mg/dL (ref 0.61–1.24)
GFR, Estimated: >60 mL/min (ref >60)
Glucose, Bld: 108 mg/dL — ABNORMAL HIGH (ref 70–99)
Potassium: 3.7 mmol/L (ref 3.5–5.1)
Sodium: 137 mmol/L (ref 135–145)

## 2023-03-18 LAB — CBC WITH DIFFERENTIAL/PLATELET
Abs Immature Granulocytes: 0.02 10*3/uL (ref 0.00–0.07)
Basophils Absolute: 0.1 10*3/uL (ref 0.0–0.1)
Basophils Relative: 1 %
Eosinophils Absolute: 0.1 10*3/uL (ref 0.0–0.5)
Eosinophils Relative: 2 %
HCT: 40.7 % (ref 39.0–52.0)
Hemoglobin: 13.5 g/dL (ref 13.0–17.0)
Immature Granulocytes: 0 %
Lymphocytes Relative: 23 %
Lymphs Abs: 1.6 10*3/uL (ref 0.7–4.0)
MCH: 28.1 pg (ref 26.0–34.0)
MCHC: 33.2 g/dL (ref 30.0–36.0)
MCV: 84.6 fL (ref 80.0–100.0)
Monocytes Absolute: 0.7 10*3/uL (ref 0.1–1.0)
Monocytes Relative: 9 %
Neutro Abs: 4.4 10*3/uL (ref 1.7–7.7)
Neutrophils Relative %: 65 %
Platelets: 248 10*3/uL (ref 150–400)
RBC: 4.81 MIL/uL (ref 4.22–5.81)
RDW: 13.2 % (ref 11.5–15.5)
WBC: 6.9 10*3/uL (ref 4.0–10.5)
nRBC: 0 % (ref 0.0–0.2)

## 2023-03-18 MED ORDER — IOHEXOL 300 MG/ML  SOLN
100.0000 mL | Freq: Once | INTRAMUSCULAR | Status: AC | PRN
  Administered 2023-03-18: 100 mL via INTRAVENOUS

## 2023-03-18 MED ORDER — SODIUM CHLORIDE 0.9 % IV SOLN
1.0000 g | INTRAVENOUS | Status: DC
  Administered 2023-03-18: 1 g via INTRAVENOUS
  Filled 2023-03-18: qty 10

## 2023-03-18 MED ORDER — CEFDINIR 300 MG PO CAPS: 300.0000 mg | ORAL_CAPSULE | Freq: Two times a day (BID) | ORAL | 0 refills | Status: AC

## 2023-03-18 NOTE — Discharge Instructions (Addendum)
You were found to have a urinary tract infection.  You are given a dose of antibiotics in the emergency department.  Please take the antibiotics for the next 10 days as prescribed.  Please follow-up with your outpatient provider.  Please return for any new, worsening, or change in symptoms or other concerns.  It was a pleasure caring for you today.

## 2023-03-18 NOTE — ED Provider Notes (Signed)
[facility] Provider Note    Event Date/Time   First MD Initiated Contact with Patient 03/18/23 [phone]     (approximate)   History   Back Pain (Pt thinks kidney stone)   HPI  Timothy Mullins is a 47 y.o. 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  No review yesterday.  He also reports that he has had strong smelling urine for the past couple of days as well.  No fevers or chills.  He reports mild discomfort.  He has not had any vomiting or diarrhea.  He thinks he might have a kidney stone which she has had multiple times in the past.  He is also worried about a urinary tract infection given that he self caths and knows that he is at higher risk for this.  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   Depression 05/21/2019   Urinary retention 04/15/2019   Septic shock (HCC) 04/15/2019   Seizure disorder (HCC) 04/15/2019   Neuromuscular dysfunction of bladder, unspecified 04/15/2019   Bacteremia due to Gram-negative bacteria 04/15/2019   Sepsis (HCC) 04/14/2019   Pyelonephritis, acute 07/05/2017   Nephrolithiasis 07/24/2016   OSA on CPAP 01/25/2016   Obesity (BMI 30-39.9) 01/25/2016   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   S/P ventriculoperitoneal shunt 06/09/2015   Vasovagal syncope 10/18/2014   Tobacco dependence 10/07/2014   Chronic headaches 03/11/2014   Migraines 08/19/2012   Spina bifida with hydrocephalus (HCC) 06/03/2012   Hereditary and idiopathic peripheral neuropathy 06/03/2012          Physical Exam   Triage Vital Signs: ED Triage Vitals  Enc Vitals Group     BP 03/18/23 0821 (!)  137/102     Pulse Rate 03/18/23 0821 85     Resp 03/18/23 0821 16     Temp 03/18/23 0821 98.4 F (36.9 C)     Temp Source 03/18/23 0821 Oral     SpO2 03/18/23 0821 97 %     Weight 03/18/23 0822 172 lb (78 kg)     Height 03/18/23 0822 5\' 2"  (1.575 m)     Head Circumference --      Peak Flow --      Pain Score 03/18/23 0822 5     Pain Loc --      Pain Edu? --      Excl. in [place]? --     Most recent vital signs: Vitals:   03/18/23 0821 03/18/23 1042  BP: (!) 137/102 130/88  Pulse: 85 80  Resp: 16 16  Temp: 98.4 F (36.9 C)   SpO2: 97% 98%    Physical Exam Vitals and nursing note reviewed.  Constitutional:      General: Awake and alert. No acute distress.    Appearance: Normal appearance. The patient is obese.  HENT:     Head: Normocephalic and atraumatic.     Mouth: Mucous membranes are moist.  Eyes:     General: PERRL. Normal EOMs        Right eye: No discharge.        Left eye: No discharge.     Conjunctiva/sclera: Conjunctivae normal.  Cardiovascular:     Rate and  Rhythm: Normal rate and regular rhythm.     Pulses: Normal pulses.  Pulmonary:     Effort: Pulmonary effort is normal. No respiratory distress.     Breath sounds: Normal breath sounds.  Abdominal:     Abdomen is soft. There is mild diffuse abdominal tenderness. No rebound or guarding. No distention.  No CVA tenderness Musculoskeletal:        General: No swelling. Normal range of motion.     Cervical back: Normal range of motion and neck supple.  Back: No midline tenderness.  Tenderness to bilateral lumbar and thoracic paraspinal muscles.  No midline tenderness.  Strength and sensation 5/5 to bilateral lower extremities. Normal great toe extension against resistance. Normal sensation throughout feet. Normal patellar reflexes. Negative SLR and opposite SLR bilaterally. Negative FABER test Skin:    General: Skin is warm and dry.     Capillary Refill: Capillary refill takes less than 2 seconds.     Findings:  No rash.  Neurological:     Mental Status: The patient is awake and alert.      ED Results / Procedures / Treatments   Labs (all labs ordered are listed, but only abnormal results are displayed) Labs Reviewed  URINALYSIS, ROUTINE W REFLEX MICROSCOPIC - Abnormal; Notable for the following components:      Result Value   Color, Urine YELLOW (*)    APPearance CLOUDY (*)    Hgb urine dipstick LARGE (*)    Protein, ur 30 (*)    Leukocytes,Ua LARGE (*)    Bacteria, UA MANY (*)    All other components within normal limits  BASIC METABOLIC PANEL - Abnormal; Notable for the following components:   CO2 19 (*)    Glucose, Bld 108 (*)    Calcium 8.8 (*)    All other components within normal limits  URINE CULTURE  CBC WITH DIFFERENTIAL/PLATELET     EKG     RADIOLOGY I independently reviewed and interpreted imaging and agree with radiologists findings.  IMPRESSION: 1. Small amount of air within the urinary bladder which could be secondary to infection or recent instrumentation. Correlate with urinalysis. 2. Otherwise, no acute abdominopelvic findings. 3. Nonobstructing left renal calculus. 4. Small hiatal hernia.     PROCEDURES:  Critical Care performed:   Procedures   MEDICATIONS ORDERED IN ED: Medications  cefTRIAXone (ROCEPHIN) 1 g in sodium chloride 0.9 % 100 mL IVPB (0 g Intravenous Stopped 03/18/23 1042)  iohexol (OMNIPAQUE) 300 MG/ML solution 100 mL (100 mLs Intravenous Contrast Given 03/18/23 0931)     IMPRESSION / MDM / ASSESSMENT AND PLAN / ED COURSE  I reviewed the triage vital signs and the nursing notes.   Differential diagnosis includes, but is not limited to, musculoskeletal strain, kidney stone, UTI, pyelonephritis.  Patient is awake and alert, hemodynamically stable and afebrile.  He is nontoxic in appearance.  I reviewed the patient's chart.  Patient is followed by urology at [facility] for his neurogenic bladder.  He was noted to have new hematuria  during his last appointment on 02/07/2023 and is scheduled for a CT urogram and cystoscopy  He has no particular CVA tenderness, though he does have tenderness to palpation of his paraspinal muscles of his back.  He has normal strength and sensation of bilateral lower extremities, not consistent with cord compression or cauda equina or radiculopathy.  Urinalysis is highly suggestive of urinary tract infection.  I reviewed patient's prior cultures, and he has had pansensitive growth.  CT  scan obtained reveals no evidence of pyelonephritis or other intra-abdominal infection, no stone noted.  He will be treated for UTI/Pyelo.  He was given a dose of Rocephin in the emergency department.  He is hemodynamically stable, tolerating oral intake, no indication for hospitalization at this time.  Urine culture was sent.  He was started on antibiotics to take orally at home.  We discussed return precautions and outpatient follow-up.  Patient understands and agrees with plan.  He was discharged in stable condition.   Patient's presentation is most consistent with acute complicated illness / injury requiring diagnostic workup.   FINAL CLINICAL IMPRESSION(S) / ED DIAGNOSES   Final diagnoses:  Lower urinary tract infectious disease     Rx / DC Orders   ED Discharge Orders          Ordered    cefdinir (OMNICEF) 300 MG capsule  2 times daily        03/18/23 1032             Note:  This document was prepared using Dragon voice recognition software and may include unintentional dictation errors.   Jackelyn Hoehn, PA-C 03/18/23 1308    Phineas Semen, MD 03/18/23 1324

## 2023-03-18 NOTE — ED Triage Notes (Signed)
Pt to ED for bilateral lower back pain since last night. Pt has hx kidney stones and says this feels the same. Denies pain, burning with urination but says is having hematuria since about 2 weeks ago. Pt states PCP diagnosed with UTI (unsure if bladder or kidney infx) 2 weeks ago and took abx.

## 2023-03-18 NOTE — ED Notes (Signed)
See triage note  Presents with flank/lower back pai  States pain started yesterday  HX of renal stone  Feels the same

## 2023-08-08 ENCOUNTER — Ambulatory Visit
Admission: EM | Admit: 2023-08-08 | Discharge: 2023-08-08 | Disposition: A | Discharge status: Home / Self Care | Attending: Family Medicine | Admitting: Family Medicine

## 2023-08-08 ENCOUNTER — Encounter: Payer: Self-pay | Admitting: *Deleted

## 2023-08-08 ENCOUNTER — Other Ambulatory Visit: Payer: Self-pay

## 2023-08-08 DIAGNOSIS — G40909 Epilepsy, unspecified, not intractable, without status epilepticus: Secondary | ICD-10-CM | POA: Insufficient documentation

## 2023-08-08 DIAGNOSIS — Z760 Encounter for issue of repeat prescription: Secondary | ICD-10-CM | POA: Insufficient documentation

## 2023-08-08 LAB — CBC WITH DIFFERENTIAL/PLATELET
Abs Immature Granulocytes: 0.01 10*3/uL (ref 0.00–0.07)
Basophils Absolute: 0.1 10*3/uL (ref 0.0–0.1)
Basophils Relative: 1 %
Eosinophils Absolute: 0 10*3/uL (ref 0.0–0.5)
Eosinophils Relative: 1 %
HCT: 44.8 % (ref 39.0–52.0)
Hemoglobin: 15.6 g/dL (ref 13.0–17.0)
Immature Granulocytes: 0 %
Lymphocytes Relative: 28 %
Lymphs Abs: 1.9 10*3/uL (ref 0.7–4.0)
MCH: 28.5 pg (ref 26.0–34.0)
MCHC: 34.8 g/dL (ref 30.0–36.0)
MCV: 81.9 fL (ref 80.0–100.0)
Monocytes Absolute: 0.4 10*3/uL (ref 0.1–1.0)
Monocytes Relative: 6 %
Neutro Abs: 4.5 10*3/uL (ref 1.7–7.7)
Neutrophils Relative %: 64 %
Platelets: 304 10*3/uL (ref 150–400)
RBC: 5.47 MIL/uL (ref 4.22–5.81)
RDW: 13.2 % (ref 11.5–15.5)
WBC: 6.9 10*3/uL (ref 4.0–10.5)
nRBC: 0 % (ref 0.0–0.2)

## 2023-08-08 LAB — BASIC METABOLIC PANEL
Anion gap: 8 (ref 5–15)
BUN: 14 mg/dL (ref 6–20)
CO2: 24 mmol/L (ref 22–32)
Calcium: 9.1 mg/dL (ref 8.9–10.3)
Chloride: 105 mmol/L (ref 98–111)
Creatinine, Ser: 1.07 mg/dL (ref 0.61–1.24)
GFR, Estimated: >60 mL/min (ref >60)
Glucose, Bld: 85 mg/dL (ref 70–99)
Potassium: 4.2 mmol/L (ref 3.5–5.1)
Sodium: 137 mmol/L (ref 135–145)

## 2023-08-08 MED ORDER — LAMOTRIGINE 200 MG PO TABS: 200.0000 mg | ORAL_TABLET | Freq: Two times a day (BID) | ORAL | 0 refills | Status: DC

## 2023-08-08 NOTE — Discharge Instructions (Signed)
Keep your neurology appointment as scheduled and have them refill your medication.  Stop by the pharmacy to pick up your prescriptions.  Follow up with your primary care provider as needed.

## 2023-08-08 NOTE — ED Provider Notes (Signed)
[facility]    CSN: [id number] Arrival date & time: 08/08/23  1657      History   Chief Complaint Chief Complaint  Patient presents with   Medication Refill    HPI Timothy Mullins is a 47 y.o. male.   HPI   Timothy Mullins presents for medication refill of his Lamictal.  He has not had a seizure in several years.  Follows with neurology and has a primary care doctor but they both deferred to each other today and no one filled his medication.  He only has 1 pill left and does not want to wait till Friday as he does not want to have any seizures over the holiday.       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   Depression 05/21/2019   Urinary retention 04/15/2019   Septic shock (HCC) 04/15/2019   Seizure disorder (HCC) 04/15/2019   Neuromuscular dysfunction of bladder, unspecified 04/15/2019   Bacteremia due to Gram-negative bacteria 04/15/2019   Sepsis (HCC) 04/14/2019   Pyelonephritis, acute 07/05/2017   Nephrolithiasis 07/24/2016   OSA on CPAP 01/25/2016   Obesity (BMI 30-39.9) 01/25/2016   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   S/P ventriculoperitoneal shunt 06/09/2015   Vasovagal syncope 10/18/2014   Tobacco dependence 10/07/2014   Chronic headaches 03/11/2014   Migraines 08/19/2012   Spina bifida with hydrocephalus (HCC) 06/03/2012   Hereditary and idiopathic peripheral neuropathy 06/03/2012    Past Surgical History:  Procedure Laterality Date   CHOLECYSTECTOMY     EXTRACORPOREAL SHOCK WAVE LITHOTRIPSY Left 05/29/2019   Procedure: EXTRACORPOREAL SHOCK WAVE LITHOTRIPSY (ESWL);  Surgeon: Vanna Scotland, MD;   Location: [facility] ORS;  Service: Urology;  Laterality: Left;   VENTRICULOPERITONEAL SHUNT         Home Medications    Prior to Admission medications   Medication Sig Start Date End Date Taking? Authorizing Provider  albuterol (VENTOLIN HFA) 108 (90 Base) MCG/ACT inhaler Inhale 2 puffs into the lungs every 6 (six) hours as needed for wheezing or shortness of breath. 09/11/22  Yes Fisher, Roselyn Bering, PA-C  budesonide-formoterol (SYMBICORT) 80-4.5 MCG/ACT inhaler Inhale 2 puffs into the lungs 2 (two) times daily. 09/11/22  Yes Fisher, Roselyn Bering, PA-C  docusate sodium (COLACE) 100 MG capsule Take 1 capsule (100 mg total) by mouth 2 (two) times daily. 05/29/19  Yes Vanna Scotland, MD  topiramate (TOPAMAX) 50 MG tablet Take 50 mg by mouth 2 (two) times daily.   Yes [provider]  fluticasone (FLONASE) 50 MCG/ACT nasal spray Place 2 sprays into both nostrils daily. 11/03/17 11/03/18  Enid Derry, PA-C  lamoTRIgine (LAMICTAL) 200 MG tablet Take 1 tablet (200 mg total) by mouth 2 (two) times daily. 08/08/23   Tura Roller, Seward Meth, DO  oxybutynin (DITROPAN-XL) 5 MG 24 hr tablet Take 5 mg by mouth at bedtime.    [provider]  tamsulosin (FLOMAX) 0.4 MG CAPS capsule Take 1 capsule (0.4 mg total) by mouth daily. 05/16/19   Jene Every, MD    Family History History reviewed. No pertinent family history.  Social History Social History   Tobacco Use  Smoking status: Former   Smokeless tobacco: Never  Substance Use Topics   Alcohol use: No   Drug use: Never     Allergies   Ativan [lorazepam], Benadryl [diphenhydramine hcl (sleep)], Iodine, and Latex   Review of Systems Review of Systems: negative unless otherwise stated in HPI.      Physical Exam Triage Vital Signs ED Triage Vitals  Encounter Vitals Group     BP 08/08/23 1748 (!) 136/95     Systolic BP Percentile --      Diastolic BP Percentile --      Pulse Rate 08/08/23 1748 89     Resp 08/08/23 1748 18     Temp  08/08/23 1748 98.1 F (36.7 C)     Temp src --      SpO2 08/08/23 1748 97 %     Weight --      Height --      Head Circumference --      Peak Flow --      Pain Score 08/08/23 1745 0     Pain Loc --      Pain Education --      Exclude from Growth Chart --    No data found.  Updated Vital Signs BP (!) 136/95   Pulse 89   Temp 98.1 F (36.7 C)   Resp 18   SpO2 97%   Visual Acuity Right Eye Distance:   Left Eye Distance:   Bilateral Distance:    Right Eye Near:   Left Eye Near:    Bilateral Near:     Physical Exam GEN: well appearing male in no acute distress  CVS: well perfused  RESP: speaking in full sentences without pause, no respiratory distress       UC Treatments / Results  Labs (all labs ordered are listed, but only abnormal results are displayed) Labs Reviewed  BASIC METABOLIC PANEL  CBC WITH DIFFERENTIAL/PLATELET    EKG  If EKG performed, see my interpretation in the MDM section  Radiology No results found.   Procedures Procedures (including critical care time)  Medications Ordered in UC Medications - No data to display  Initial Impression / Assessment and Plan / UC Course  I have reviewed the triage vital signs and the nursing notes.  Pertinent labs & imaging results that were available during my care of the patient were reviewed by me and considered in my medical decision making (see chart for details).       Patient is a 47 y.o. male  who presents for medication refill of his seizure medication.  Overall patient is nontoxic-appearing and afebrile.  Vital signs stable.  On chart review, he has been taking 200 mg of Lamictal thrice a day for a very long time.  He has not had any seizures recently.  Has an upcoming appointment with his neurologist on August 18, 2023.  CBC and BMP are normal.  Refilled patient's Lamictal to his preferred pharmacy.  He is to follow-up with his neurologist as scheduled.   Discussed MDM, treatment plan and  plan for follow-up with patient who agrees with plan.    Final Clinical Impressions(s) / UC Diagnoses   Final diagnoses:  Medication refill  Seizure disorder [facility])     Discharge Instructions      Keep your neurology appointment as scheduled and have them refill your medication.  Stop by the pharmacy to pick up your prescriptions.  Follow up with your primary care provider as needed.  ED Prescriptions     Medication Sig Dispense Auth. Provider   lamoTRIgine (LAMICTAL) 200 MG tablet Take 1 tablet (200 mg total) by mouth 2 (two) times daily. 60 tablet Katha Cabal, DO      PDMP not reviewed this encounter.   Katha Cabal, DO 08/08/23 1857

## 2023-08-08 NOTE — ED Triage Notes (Signed)
Pt presents today for refill on Med. Lamictal

## 2023-09-18 ENCOUNTER — Ambulatory Visit (INDEPENDENT_AMBULATORY_CARE_PROVIDER_SITE_OTHER): Admitting: Family Medicine

## 2023-09-18 ENCOUNTER — Encounter: Payer: Self-pay | Admitting: Family Medicine

## 2023-09-18 VITALS — BP 132/80 | HR 80 | Ht 62.0 in | Wt 174.0 lb

## 2023-09-18 DIAGNOSIS — J4540 Moderate persistent asthma, uncomplicated: Secondary | ICD-10-CM

## 2023-09-18 DIAGNOSIS — Q054 Unspecified spina bifida with hydrocephalus: Secondary | ICD-10-CM

## 2023-09-18 DIAGNOSIS — G40909 Epilepsy, unspecified, not intractable, without status epilepticus: Secondary | ICD-10-CM

## 2023-09-18 DIAGNOSIS — G43109 Migraine with aura, not intractable, without status migrainosus: Secondary | ICD-10-CM

## 2023-09-18 DIAGNOSIS — K219 Gastro-esophageal reflux disease without esophagitis: Secondary | ICD-10-CM

## 2023-09-18 DIAGNOSIS — N319 Neuromuscular dysfunction of bladder, unspecified: Secondary | ICD-10-CM | POA: Diagnosis not present

## 2023-09-18 DIAGNOSIS — I10 Essential (primary) hypertension: Secondary | ICD-10-CM | POA: Diagnosis not present

## 2023-09-18 DIAGNOSIS — G4733 Obstructive sleep apnea (adult) (pediatric): Secondary | ICD-10-CM

## 2023-09-18 MED ORDER — TOPIRAMATE 50 MG PO TABS: 50.0000 mg | ORAL_TABLET | Freq: Two times a day (BID) | ORAL | 3 refills | Status: DC

## 2023-09-18 MED ORDER — ALBUTEROL SULFATE HFA 108 (90 BASE) MCG/ACT IN AERS: 2.0000 | INHALATION_SPRAY | Freq: Four times a day (QID) | RESPIRATORY_TRACT | 2 refills | Status: DC | PRN

## 2023-09-18 MED ORDER — LAMOTRIGINE 200 MG PO TABS: 200.0000 mg | ORAL_TABLET | Freq: Two times a day (BID) | ORAL | 3 refills | Status: DC

## 2023-09-18 MED ORDER — LAMOTRIGINE 200 MG PO TABS: 200.0000 mg | ORAL_TABLET | Freq: Two times a day (BID) | ORAL | 0 refills | Status: DC

## 2023-09-18 MED ORDER — PANTOPRAZOLE SODIUM 40 MG PO TBEC: 40.0000 mg | DELAYED_RELEASE_TABLET | Freq: Two times a day (BID) | ORAL | 3 refills | Status: AC

## 2023-09-18 MED ORDER — PANTOPRAZOLE SODIUM 40 MG PO TBEC: 40.0000 mg | DELAYED_RELEASE_TABLET | Freq: Two times a day (BID) | ORAL | 3 refills | Status: DC

## 2023-09-18 NOTE — Progress Notes (Signed)
 New patient visit   Patient: Timothy Mullins   DOB: [birth year]   48 y.o. Male  MRN: [id number] Visit Date: 09/18/2023  Today's healthcare provider: Rockie Agent, MD   Chief Complaint  Patient presents with   New Patient (Initial Visit)    Establish-nausea/acid reflux problem especially after eating food--1 year. Refill on meds   Subjective    Timothy Mullins is a 48 y.o. male who presents today as a new patient to establish care.   HPI     New Patient (Initial Visit)    Additional comments: Establish-nausea/acid reflux problem especially after eating food--1 year. Refill on meds      Last edited by Deitra Therisa HERO, CMA on 09/18/2023  2:39 PM.       Discussed the use of AI scribe software for clinical note transcription with the patient, who gave verbal consent to proceed.  History of Present Illness   The patient is a 48 year old male with a complex medical history including asthma, hydrocephalus, neurogenic bladder, renal disorders, seizures, and spina bifida. He is currently on a regimen of Topamax , Lamictal , albuterol , Flomax , oxybutynin, and Symbicort .  The patient reports a history of kidney problems, which he attributes to his father's exposure to Agent Orange during the [place] War and his own diagnosis of spina bifida. He self-catheterizes and wears special underwear due to these issues.  The patient also reports a history of migraines, which occur sporadically and require him to rest in a dark room. He has a history of seizures, but has not experienced one in approximately ten years. He is currently managed by a neurosurgeon and is seeking a new neurologist.  The patient has a history of asthma, which affects him daily, particularly in the mornings and during the summer. He reports daily symptoms of coughing, wheezing, and shortness of breath.  The patient has a significant smoking history, having smoked five packs a day for two years, but quit  approximately 21 years ago.  The patient also reports a history of high blood pressure, previously managed with Lisinopril, but has not taken this medication for approximately 13-14 years.  Recently, the patient has been experiencing reflux and nausea after eating, which has been ongoing for a year. He reports one severe episode of acid reflux that resulted in multiple episodes of vomiting. He has not noticed any blood in his stool or vomit.  The patient is seeking a sleep study due to concerns about sleep apnea, as he has been prescribed a CPAP machine in the past but has not been able to obtain one. He also expresses a desire to establish care with a new urologist and neurologist.          Past Medical History:  Diagnosis Date   Asthma    Hydrocephalus (HCC)    Neurogenic bladder    Renal disorder    Seizures (HCC)    Spina bifida (HCC)     Outpatient Medications Prior to Visit  Medication Sig   budesonide -formoterol  (SYMBICORT ) 80-4.5 MCG/ACT inhaler Inhale 2 puffs into the lungs 2 (two) times daily.   docusate sodium  (COLACE) 100 MG capsule Take 1 capsule (100 mg total) by mouth 2 (two) times daily.   oxybutynin (DITROPAN-XL) 5 MG 24 hr tablet Take 5 mg by mouth at bedtime.   tamsulosin  (FLOMAX ) 0.4 MG CAPS capsule Take 1 capsule (0.4 mg total) by mouth daily.   [DISCONTINUED] albuterol  (VENTOLIN  HFA) 108 (90 Base) MCG/ACT inhaler Inhale 2 puffs into  the lungs every 6 (six) hours as needed for wheezing or shortness of breath.   [DISCONTINUED] lamoTRIgine  (LAMICTAL ) 200 MG tablet Take 1 tablet (200 mg total) by mouth 2 (two) times daily.   [DISCONTINUED] topiramate  (TOPAMAX ) 50 MG tablet Take 50 mg by mouth 2 (two) times daily.   fluticasone  (FLONASE ) 50 MCG/ACT nasal spray Place 2 sprays into both nostrils daily.   No facility-administered medications prior to visit.    Past Surgical History:  Procedure Laterality Date   CHOLECYSTECTOMY     EXTRACORPOREAL SHOCK WAVE  LITHOTRIPSY Left 05/29/2019   Procedure: EXTRACORPOREAL SHOCK WAVE LITHOTRIPSY (ESWL);  Surgeon: Penne Knee, MD;  Location: [facility] ORS;  Service: Urology;  Laterality: Left;   VENTRICULOPERITONEAL SHUNT     Family Status  Relation Name Status   Mother  (Not Specified)   Father  Alive   Sister  (Not Specified)   Brother  (Not Specified)  No partnership data on file   Family History  Problem Relation Age of Onset   Hyperlipidemia Mother    COPD Mother    Emphysema Mother    Emphysema Father    COPD Father    Hyperlipidemia Father    Hypertension Father    Asthma Father    Heart attack Father    Sleep apnea Father    Asthma Sister    Heart murmur Brother    Social History   Socioeconomic History   Marital status: Married    Spouse name: Not on file   Number of children: Not on file   Years of education: Not on file   Highest education level: Not on file  Occupational History   Not on file  Tobacco Use   Smoking status: Former    Average packs/day: 5.0 packs/day for 2.0 years (10.0 ttl pk-yrs)    Types: Cigarettes    Start date: 2002   Smokeless tobacco: Never  Substance and Sexual Activity   Alcohol use: No   Drug use: Never   Sexual activity: Not on file  Other Topics Concern   Not on file  Social History Narrative   Not on file   Social Drivers of Health   Financial Resource Strain: Not on file  Food Insecurity: Not on file  Transportation Needs: Not on file  Physical Activity: Not on file  Stress: Not on file  Social Connections: Unknown (01/22/2022)   [facility]   Social Network    Social Network: Not on file     Allergies  Allergen Reactions   Ativan [Lorazepam] Other (See Comments)    see thing   Benadryl [Diphenhydramine Hcl (Sleep)] Hives   Iodine Rash   Latex Rash    Immunization History  Administered Date(s) Administered   PFIZER(Purple Top)SARS-COV-2 Vaccination 08/10/2020, 08/31/2020   Tdap  12/27/2017    Health Maintenance  Topic Date Due   Pneumococcal Vaccine 74-8 Years old (1 of 2 - PCV) Never done   HIV Screening  Never done   Hepatitis C Screening  Never done   Colonoscopy  Never done   COVID-19 Vaccine (3 - 2024-25 season) 05/13/2023   INFLUENZA VACCINE  12/10/2023 (Originally 04/12/2023)   DTaP/Tdap/Td (2 - Td or Tdap) 12/28/2027   HPV VACCINES  Aged Out    Patient Care Team: Sharma Coyer, MD as PCP - General (Family Medicine)  Review of Systems  Last CBC Lab Results  Component Value Date   WBC 6.9 08/08/2023   HGB 15.6 08/08/2023  HCT 44.8 08/08/2023   MCV 81.9 08/08/2023   MCH 28.5 08/08/2023   RDW 13.2 08/08/2023   PLT 304 08/08/2023   Last metabolic panel Lab Results  Component Value Date   GLUCOSE 85 08/08/2023   NA 137 08/08/2023   K 4.2 08/08/2023   CL 105 08/08/2023   CO2 24 08/08/2023   BUN 14 08/08/2023   CREATININE 1.07 08/08/2023   GFRNONAA >60 08/08/2023   CALCIUM 9.1 08/08/2023   PROT 6.8 05/19/2019   ALBUMIN 3.7 05/19/2019   BILITOT 1.0 05/19/2019   ALKPHOS 65 05/19/2019   AST 16 05/19/2019   ALT 18 05/19/2019   ANIONGAP 8 08/08/2023   Last lipids No results found for: CHOL, HDL, LDLCALC, LDLDIRECT, TRIG, CHOLHDL Last hemoglobin A1c No results found for: HGBA1C Last thyroid functions No results found for: TSH, T3TOTAL, T4TOTAL, THYROIDAB Last vitamin D No results found for: 25OHVITD2, 25OHVITD3, VD25OH Last vitamin B12 and Folate No results found for: VITAMINB12, FOLATE      Objective    BP 132/80 (Cuff Size: Normal)   Pulse 80   Ht 5' 2 (1.575 m)   Wt 174 lb (78.9 kg)   SpO2 100%   BMI 31.83 kg/m  BP Readings from Last 3 Encounters:  09/18/23 132/80  08/08/23 (!) 136/95  03/18/23 130/88   Wt Readings from Last 3 Encounters:  09/18/23 174 lb (78.9 kg)  03/18/23 172 lb (78 kg)  02/02/23 173 lb (78.5 kg)        Depression Screen     No data to  display         No results found for any visits on 09/18/23.   Physical Exam Vitals reviewed.  Constitutional:      General: He is not in acute distress.    Appearance: Normal appearance. He is not ill-appearing, toxic-appearing or diaphoretic.  Eyes:     Conjunctiva/sclera: Conjunctivae normal.  Cardiovascular:     Rate and Rhythm: Normal rate and regular rhythm.     Pulses: Normal pulses.     Heart sounds: Normal heart sounds. No murmur heard.    No friction rub. No gallop.  Pulmonary:     Effort: Pulmonary effort is normal. No respiratory distress.     Breath sounds: Normal breath sounds. No stridor. No wheezing, rhonchi or rales.  Abdominal:     General: Bowel sounds are normal. There is no distension.     Palpations: Abdomen is soft.     Tenderness: There is no abdominal tenderness.  Musculoskeletal:     Right lower leg: No edema.     Left lower leg: No edema.  Skin:    Findings: No erythema or rash.  Neurological:     Mental Status: He is alert and oriented to person, place, and time.     Comments: Ambulates independently        Assessment & Plan      Problem List Items Addressed This Visit       Cardiovascular and Mediastinum   Primary hypertension - Primary   Chronic  Previously managed with lisinopril. Current blood pressure reading is 136/96 mmHg. Monitoring blood pressure and considering reinitiating antihypertensive therapy if readings remain elevated. - Monitor blood pressure and consider reinitiating antihypertensive therapy if readings remain elevated      Migraines   Chronic  Occasional migraines requiring rest in a dark room. No current medication. -neurology referral placed  - Monitor and consider future treatment options if migraines become more frequent  or severe      Relevant Medications   lamoTRIgine  (LAMICTAL ) 200 MG tablet   topiramate  (TOPAMAX ) 50 MG tablet   Other Relevant Orders   Ambulatory referral to Neurology      Respiratory   OSA on CPAP   Chronic problem  Reports he was never able to get the equipment for CPAP  Referral for pulmonology and request for sleep study submitted today       Relevant Orders   Ambulatory referral to Pulmonology   Moderate persistent asthma without complication   Daily symptoms including coughing, wheezing, and shortness of breath, particularly in the morning and more severe during the summer. Managed with albuterol  and Symbicort . Referred to pulmonology for further management and to facilitate a sleep study. - Continue Albuterol  108 mcg as needed, Symbicort  80/4.5 mcg two puffs twice daily - Refer to pulmonology for further management and to facilitate a sleep study      Relevant Medications   albuterol  (VENTOLIN  HFA) 108 (90 Base) MCG/ACT inhaler     Digestive   GERD (gastroesophageal reflux disease)   Chronic  Experiencing reflux and nausea after eating for the past year, with severe episodes involving vomiting, particularly after consuming spicy foods. No hematemesis or melena. Protonix  prescribed to manage symptoms. - Prescribe Protonix  40 mg, to be taken 30 minutes before lunch and dinner - Schedule follow-up in 6 weeks to assess symptom improvement      Relevant Medications   pantoprazole  (PROTONIX ) 40 MG tablet     Nervous and Auditory   Spina bifida with hydrocephalus (HCC)   No acute issues reported. -neurology referral placed  - Monitor and continue current management      Relevant Orders   Ambulatory referral to Neurology   Seizure disorder [facility])   Chronic  Well-controlled with no seizures reported in the past ten years. Managed with Topamax  and Lamictal . Referred to neurology for ongoing management and to establish care with a new neurologist. - Continue Topamax  50 mg twice daily, Lamictal  200 mg daily, refills ordered  - Refer to neurology for ongoing management and to establish care with a new neurologist      Relevant Medications    lamoTRIgine  (LAMICTAL ) 200 MG tablet   topiramate  (TOPAMAX ) 50 MG tablet   Other Relevant Orders   Ambulatory referral to Neurology     Other   Neuromuscular dysfunction of bladder, unspecified   Managed with catheterization, Flomax , and oxybutynin. Referred to urology to establish care with a new urologist. - Continue Flomax  0.4 mg daily, oxybutynin 5 mg daily at bedtime - Refer to urology to establish care with a new urologist      Relevant Orders   Ambulatory referral to Urology    General Health Maintenance Quit smoking in 2004. Last blood work in November 2024 was normal. - Encourage continued abstinence from smoking - No additional blood work needed at this time     Return in about 6 weeks (around 10/30/2023) for GERD.      Rockie Agent, MD  [facility] [phone] (phone) [phone] (fax)  [facility]

## 2023-09-19 NOTE — Assessment & Plan Note (Signed)
 No acute issues reported. -neurology referral placed  - Monitor and continue current management

## 2023-09-19 NOTE — Assessment & Plan Note (Signed)
 Daily symptoms including coughing, wheezing, and shortness of breath, particularly in the morning and more severe during the summer. Managed with albuterol  and Symbicort . Referred to pulmonology for further management and to facilitate a sleep study. - Continue Albuterol  108 mcg as needed, Symbicort  80/4.5 mcg two puffs twice daily - Refer to pulmonology for further management and to facilitate a sleep study

## 2023-09-19 NOTE — Assessment & Plan Note (Signed)
 Chronic  Previously managed with lisinopril. Current blood pressure reading is 136/96 mmHg. Monitoring blood pressure and considering reinitiating antihypertensive therapy if readings remain elevated. - Monitor blood pressure and consider reinitiating antihypertensive therapy if readings remain elevated

## 2023-09-19 NOTE — Assessment & Plan Note (Signed)
 Chronic  Well-controlled with no seizures reported in the past ten years. Managed with Topamax  and Lamictal . Referred to neurology for ongoing management and to establish care with a new neurologist. - Continue Topamax  50 mg twice daily, Lamictal  200 mg daily, refills ordered  - Refer to neurology for ongoing management and to establish care with a new neurologist

## 2023-09-19 NOTE — Assessment & Plan Note (Signed)
 Managed with catheterization, Flomax, and oxybutynin. Referred to urology to establish care with a new urologist. - Continue Flomax 0.4 mg daily, oxybutynin 5 mg daily at bedtime - Refer to urology to establish care with a new urologist

## 2023-09-19 NOTE — Assessment & Plan Note (Signed)
 Chronic  Experiencing reflux and nausea after eating for the past year, with severe episodes involving vomiting, particularly after consuming spicy foods. No hematemesis or melena. Protonix  prescribed to manage symptoms. - Prescribe Protonix  40 mg, to be taken 30 minutes before lunch and dinner - Schedule follow-up in 6 weeks to assess symptom improvement

## 2023-09-19 NOTE — Assessment & Plan Note (Signed)
 Chronic  Occasional migraines requiring rest in a dark room. No current medication. -neurology referral placed  - Monitor and consider future treatment options if migraines become more frequent or severe

## 2023-09-19 NOTE — Assessment & Plan Note (Signed)
 Chronic problem  Reports he was never able to get the equipment for CPAP  Referral for pulmonology and request for sleep study submitted today

## 2023-10-17 ENCOUNTER — Institutional Professional Consult (permissible substitution): Admitting: Internal Medicine

## 2023-10-18 ENCOUNTER — Ambulatory Visit (INDEPENDENT_AMBULATORY_CARE_PROVIDER_SITE_OTHER): Admitting: Internal Medicine

## 2023-10-18 ENCOUNTER — Encounter: Payer: Self-pay | Admitting: Internal Medicine

## 2023-10-18 VITALS — BP 140/86 | HR 103 | Temp 97.6°F | Ht 62.0 in | Wt 178.2 lb

## 2023-10-18 DIAGNOSIS — J449 Chronic obstructive pulmonary disease, unspecified: Secondary | ICD-10-CM | POA: Diagnosis not present

## 2023-10-18 DIAGNOSIS — G4733 Obstructive sleep apnea (adult) (pediatric): Secondary | ICD-10-CM

## 2023-10-18 MED ORDER — BUDESONIDE-FORMOTEROL FUMARATE 80-4.5 MCG/ACT IN AERO: 2.0000 | INHALATION_SPRAY | Freq: Two times a day (BID) | RESPIRATORY_TRACT | 10 refills | Status: AC

## 2023-10-18 NOTE — Patient Instructions (Addendum)
 Recommend Home Sleep Study to assess for Sleep Apnea  Will obtain breathing test to assess for lung function Continue Symbicort  2 puffs in the morning 2 puffs at night Use albuterol  as needed for cough and wheezing  Avoid Allergens and Irritants Avoid secondhand smoke Avoid SICK contacts Recommend  Masking  when appropriate Recommend Keep up-to-date with vaccinations   Be aware of reduced alertness and do not drive or operate heavy machinery if experiencing this or drowsiness.  Exercise encouraged, as tolerated. Encouraged proper weight management.  Important to get eight or more hours of sleep  Limiting the use of the computer and television before bedtime.  Decrease naps during the day, so night time sleep will become enhanced.  Limit caffeine, and sleep deprivation.

## 2023-10-18 NOTE — Progress Notes (Signed)
 Name: Timothy Mullins MRN: [id number] DOB: Feb 15, 1976    CHIEF COMPLAINT:   EXCESSIVE DAYTIME SLEEPINESS Assessment for shortness of breath and COPD   HISTORY OF PRESENT ILLNESS: Patient is seen today for problems and issues with sleep related to excessive daytime sleepiness Patient  has been having sleep problems for many years Patient has been having excessive daytime sleepiness for a long time Patient has been having extreme fatigue and tiredness, lack of energy +  very Loud snoring every night + struggling breathe at night and gasps for air + morning headaches + Nonrefreshing sleep  Discussed sleep data and reviewed with patient.  Encouraged proper weight management.  Discussed driving precautions and its relationship with hypersomnolence.  Discussed operating dangerous equipment and its relationship with hypersomnolence.  Discussed sleep hygiene, and benefits of a fixed sleep waked time.  The importance of getting eight or more hours of sleep discussed with patient.  Discussed limiting the use of the computer and television before bedtime.  Decrease naps during the day, so night time sleep will become enhanced.  Limit caffeine, and sleep deprivation.  HTN, stroke, and heart failure are potential risk factors.    EPWORTH SLEEP SCORE 4   No evidence of heart failure at this time No evidence or signs of infection at this time No respiratory distress No fevers, chills, nausea, vomiting, diarrhea No evidence of lower extremity edema No evidence hemoptysis  Former smoker 3 to 4 packs a day Quit 15 years ago  symptoms of shortness of breath and dyspnea on exertion Intermittent wheezing and cough Have explained to him he may have underlying COPD Will need breathing test to confirm and evaluate Patient currently on Symbicort  but uses as needed Advised to use Symbicort  2 puffs in the morning and 2 puffs at night on a daily basis    PAST MEDICAL HISTORY :   has a past  medical history of Asthma, Hydrocephalus (HCC), Neurogenic bladder, Renal disorder, Seizures (HCC), and Spina bifida (HCC).  has a past surgical history that includes Cholecystectomy; Ventriculoperitoneal shunt; and Extracorporeal shock wave lithotripsy (Left, 05/29/2019). Prior to Admission medications   Medication Sig Start Date End Date Taking? Authorizing Provider  albuterol  (VENTOLIN  HFA) 108 (90 Base) MCG/ACT inhaler Inhale 2 puffs into the lungs every 6 (six) hours as needed for wheezing or shortness of breath. 09/18/23   Simmons-Robinson, Rockie, MD  budesonide -formoterol  (SYMBICORT ) 80-4.5 MCG/ACT inhaler Inhale 2 puffs into the lungs 2 (two) times daily. 09/11/22   Fisher, Devere ORN, PA-C  docusate sodium  (COLACE) 100 MG capsule Take 1 capsule (100 mg total) by mouth 2 (two) times daily. 05/29/19   Penne Knee, MD  fluticasone  (FLONASE ) 50 MCG/ACT nasal spray Place 2 sprays into both nostrils daily. 11/03/17 11/03/18  Alona Knee, PA-C  lamoTRIgine  (LAMICTAL ) 200 MG tablet Take 1 tablet (200 mg total) by mouth 2 (two) times daily. 09/18/23   Simmons-Robinson, Makiera, MD  oxybutynin (DITROPAN-XL) 5 MG 24 hr tablet Take 5 mg by mouth at bedtime.    [provider]  pantoprazole  (PROTONIX ) 40 MG tablet Take 1 tablet (40 mg total) by mouth 2 (two) times daily before a meal. 09/18/23   Simmons-Robinson, Rockie, MD  tamsulosin  (FLOMAX ) 0.4 MG CAPS capsule Take 1 capsule (0.4 mg total) by mouth daily. 05/16/19   Arlander Charleston, MD  topiramate  (TOPAMAX ) 50 MG tablet Take 1 tablet (50 mg total) by mouth 2 (two) times daily. 09/18/23   Simmons-Robinson, Rockie, MD   Allergies  Allergen  Reactions   Ativan [Lorazepam] Other (See Comments)    see thing   Benadryl [Diphenhydramine Hcl (Sleep)] Hives   Iodine Rash   Latex Rash    FAMILY HISTORY:  family history includes Asthma in his father and sister; COPD in his father and mother; Emphysema in his father and mother; Heart attack in his  father; Heart murmur in his brother; Hyperlipidemia in his father and mother; Hypertension in his father; Sleep apnea in his father. SOCIAL HISTORY:  reports that he has quit smoking. His smoking use included cigarettes. He started smoking about 23 years ago. He has a 10 pack-year smoking history. He has never used smokeless tobacco. He reports that he does not drink alcohol and does not use drugs.   Review of Systems:  Gen:  Denies  fever, sweats, chills weight loss  HEENT: Denies blurred vision, double vision, ear pain, eye pain, hearing loss, nose bleeds, sore throat Cardiac:  No dizziness, chest pain or heaviness, chest tightness,edema, No JVD Resp:   No cough, -sputum production, +shortness of breath,-wheezing, -hemoptysis,  Gi: Denies swallowing difficulty, stomach pain, nausea or vomiting, diarrhea, constipation, bowel incontinence Gu:  Denies bladder incontinence, burning urine Ext:   Denies Joint pain, stiffness or swelling Skin: Denies  skin rash, easy bruising or bleeding or hives Endoc:  Denies polyuria, polydipsia , polyphagia or weight change Psych:   Denies depression, insomnia or hallucinations  Other:  All other systems negative   ALL OTHER ROS ARE NEGATIVE   BP (!) 140/86 (BP Location: Left Arm, Patient Position: Sitting, Cuff Size: Normal)   Pulse (!) 103   Temp 97.6 F (36.4 C) (Temporal)   Ht 5' 2 (1.575 m)   Wt 178 lb 3.2 oz (80.8 kg)   SpO2 98%   BMI 32.59 kg/m     Physical Examination:   General Appearance: No distress  EYES PERRLA, EOM intact.   NECK Supple, No JVD ORAL CAVITY MALLAMPATI 3 Pulmonary: normal breath sounds, No wheezing.  CardiovascularNormal S1,S2.  No m/r/g.   Abdomen: Benign, Soft, non-tender. Skin:   warm, no rashes, no ecchymosis  Extremities: normal, no cyanosis, clubbing. Neuro:without focal findings,  speech normal  PSYCHIATRIC: Mood, affect within normal limits.   ALL OTHER ROS ARE NEGATIVE    ASSESSMENT AND  PLAN SYNOPSIS  48 year old white male patient with signs and symptoms of excessive daytime sleepiness with probable underlying diagnosis of obstructive sleep apnea in the setting of  deconditioned state with a extensive previous tobacco abuse of 3 to 4 packs a day smoker with symptoms of shortness of breath and dyspnea on exertion with intermittent cough and wheezing probable underlying COPD   Recommend Sleep Study for definitve diagnosis Recommend home sleep study to assess for sleep apnea  Be aware of reduced alertness and do not drive or operate heavy machinery if experiencing this or drowsiness.  Exercise encouraged, as tolerated. Encouraged proper weight management.  Important to get eight or more hours of sleep  Limiting the use of the computer and television before bedtime.  Decrease naps during the day, so night time sleep will become enhanced.  Limit caffeine, and sleep deprivation.     Assessment of underlying lung disease Obtain pulmonary function testing Continue Symbicort  as prescribed Continue albuterol  as needed   Deconditioned state -Recommend increased daily activity and exercise   MEDICATION ADJUSTMENTS/LABS AND TESTS ORDERED: Recommend Home Sleep Study to assess for Sleep Apnea Will obtain breathing test to assess for lung function Continue Symbicort  2  puffs in the morning 2 puffs at night Use albuterol  as needed for cough and wheezing Avoid Allergens and Irritants Avoid secondhand smoke Avoid SICK contacts Recommend  Masking  when appropriate Recommend Keep up-to-date with vaccinations   CURRENT MEDICATIONS REVIEWED AT LENGTH WITH PATIENT TODAY   Patient  satisfied with Plan of action and management. All questions answered  Follow up  4 weeks  I spent a total of  62 minutes reviewing chart data, face-to-face evaluation with the patient, counseling and coordination of care as detailed above.    Nickolas Alm Cellar, M.D.  [facility]  Medical Director [facility] Medical Director [facility] Cardio-Pulmonary Department

## 2023-10-23 ENCOUNTER — Ambulatory Visit: Admitting: Urology

## 2023-10-24 ENCOUNTER — Encounter: Payer: Self-pay | Admitting: Urology

## 2023-10-30 ENCOUNTER — Ambulatory Visit: Payer: Self-pay | Admitting: Family Medicine

## 2023-11-12 ENCOUNTER — Telehealth: Payer: Self-pay | Admitting: Internal Medicine

## 2023-11-12 DIAGNOSIS — G4733 Obstructive sleep apnea (adult) (pediatric): Secondary | ICD-10-CM

## 2023-11-12 NOTE — Telephone Encounter (Signed)
 Patient's wife called and states insurance does not cover HST through [employer]. Please advise on next steps.

## 2023-11-12 NOTE — Telephone Encounter (Signed)
 I spoke with [name] with [facility] and she stated the patient hasn't did the online registration and never spoke with anyone. I finally got to speak with the wife  and she stated who ever  [name]  spoke with stated they were not in network with his insurance [employer].   Patient and wife have decided to do in lab sleep study that way their insurance would cover it

## 2023-11-13 NOTE — Telephone Encounter (Signed)
 In lab sleep placed. Nothing further needed.

## 2023-11-16 ENCOUNTER — Ambulatory Visit: Admitting: Family Medicine

## 2023-11-28 ENCOUNTER — Ambulatory Visit: Admitting: Family Medicine

## 2023-12-13 ENCOUNTER — Ambulatory Visit: Admitting: Cardiology

## 2024-01-09 ENCOUNTER — Ambulatory Visit: Admitting: Cardiology

## 2024-01-09 ENCOUNTER — Ambulatory Visit: Attending: Otolaryngology

## 2024-01-09 DIAGNOSIS — R0683 Snoring: Secondary | ICD-10-CM | POA: Insufficient documentation

## 2024-01-09 DIAGNOSIS — J4489 Other specified chronic obstructive pulmonary disease: Secondary | ICD-10-CM | POA: Diagnosis not present

## 2024-01-09 DIAGNOSIS — I10 Essential (primary) hypertension: Secondary | ICD-10-CM | POA: Insufficient documentation

## 2024-01-09 DIAGNOSIS — G4733 Obstructive sleep apnea (adult) (pediatric): Secondary | ICD-10-CM | POA: Diagnosis not present

## 2024-01-09 DIAGNOSIS — R109 Unspecified abdominal pain: Secondary | ICD-10-CM | POA: Insufficient documentation

## 2024-01-09 DIAGNOSIS — G4710 Hypersomnia, unspecified: Secondary | ICD-10-CM | POA: Diagnosis not present

## 2024-01-21 DIAGNOSIS — G4733 Obstructive sleep apnea (adult) (pediatric): Secondary | ICD-10-CM

## 2024-01-25 ENCOUNTER — Ambulatory Visit: Payer: Self-pay | Admitting: Family Medicine

## 2024-01-25 ENCOUNTER — Telehealth: Payer: Self-pay

## 2024-01-25 NOTE — Telephone Encounter (Signed)
 Timothy Mullins

## 2024-01-25 NOTE — Telephone Encounter (Signed)
 Called and spoke to the pt to inform him to reach out to his pulmonologist in regards to his sleep study. He stated he understood and had no other questions

## 2024-01-25 NOTE — Telephone Encounter (Signed)
 Copied from CRM [id number]. Topic: Clinical - Medication Question >> Jan 25, 2024  3:11 PM Chrystal Crape R wrote: Pt calling to ask if there is any additional information on his sleep study. He states he also missed a call from the office. I notified him to stay close tot the phone as he may receive another call back.

## 2024-01-31 ENCOUNTER — Ambulatory Visit: Admitting: Cardiology

## 2024-02-06 ENCOUNTER — Ambulatory Visit (INDEPENDENT_AMBULATORY_CARE_PROVIDER_SITE_OTHER): Admitting: Sleep Medicine

## 2024-02-06 NOTE — Telephone Encounter (Signed)
 Please notify patient that PSG revealed mild OSA, recommend proceeding with APAP therapy set to 4-16 cm H2O, EPR 3 with the Airtouch N30i nasal mask. Please also schedule a 3 month follow up visit if patient wishes to proceed with CPAP therapy. Thanks

## 2024-02-08 ENCOUNTER — Telehealth: Payer: Self-pay | Admitting: Internal Medicine

## 2024-02-08 DIAGNOSIS — G4733 Obstructive sleep apnea (adult) (pediatric): Secondary | ICD-10-CM

## 2024-02-08 NOTE — Telephone Encounter (Signed)
 Patient needs to be scheduled for PFT and follow up visit with Dr. Auston Left.

## 2024-02-12 NOTE — Telephone Encounter (Signed)
 Copied from CRM [id number]. Topic: Clinical - Lab/Test Results >> Feb 12, 2024  1:16 PM Eveleen Hinds B wrote: Reason for CRM: Patient's wife calling to discuss sleep study results. Patient there also. Stated no one has called him with his results. Patent states to have office call wife's phone [phone].

## 2024-02-12 NOTE — Telephone Encounter (Signed)
 Patient advised of sleep study. Patient agreed to CPAP. DME order placed. Patient has fu appt. NFN.

## 2024-02-25 ENCOUNTER — Ambulatory Visit: Attending: Cardiology | Admitting: Cardiology

## 2024-05-06 ENCOUNTER — Telehealth: Payer: Self-pay

## 2024-05-06 NOTE — Telephone Encounter (Signed)
 Timothy Mullins [name], [name], CMA I never got that note from [facility] because I still have the order waiting.I will send to [facility]  [name]   Previous Messages    ----- Message ----- From: [name] Exie PARAS, CMA Sent: 05/06/2024   8:01 AM EDT To: [name] Mullins Timothy Subject: FW: add to wyvonne                            Just so we all know -- he probably never got set up. ----- Message ----- From: [name] LABOR Sent: 05/05/2024   8:08 PM EDT To: Exie PARAS [name], CMA Subject: RE: add to wyvonne Erskin Exie  We sent a fax in June that his insurance was not in network with us .  Not sure where he was sent to fill for his machine. It looks like we also tried to contact the patient numerous time with no response back or call back. ----- Message ----- From: [name] Exie PARAS, CMA Sent: 05/05/2024   2:41 PM EDT To: Oneil LABOR Adrien Subject: add to wyvonne [name],  This is Exie [name], CMA for [place] with Dr. Isaiah at [facility]. I was wondering if we could please be added to this pt's airview. He has an upcoming appointment and I would like to get a download prior to it.  Thanks,  Z

## 2024-05-06 NOTE — Telephone Encounter (Signed)
 CPAP order has been re-routed to [employer] per [name].

## 2024-05-07 ENCOUNTER — Encounter

## 2024-05-07 ENCOUNTER — Ambulatory Visit: Admitting: Internal Medicine

## 2024-06-04 ENCOUNTER — Ambulatory Visit: Admitting: Physician Assistant

## 2024-06-04 ENCOUNTER — Encounter: Payer: Self-pay | Admitting: Physician Assistant

## 2024-06-04 ENCOUNTER — Ambulatory Visit: Payer: Self-pay

## 2024-06-04 VITALS — BP 141/86 | HR 106 | Temp 99.3°F | Resp 16 | Ht 62.0 in | Wt 179.3 lb

## 2024-06-04 DIAGNOSIS — R0981 Nasal congestion: Secondary | ICD-10-CM

## 2024-06-04 DIAGNOSIS — R059 Cough, unspecified: Secondary | ICD-10-CM

## 2024-06-04 DIAGNOSIS — I10 Essential (primary) hypertension: Secondary | ICD-10-CM | POA: Diagnosis not present

## 2024-06-04 DIAGNOSIS — J4540 Moderate persistent asthma, uncomplicated: Secondary | ICD-10-CM | POA: Diagnosis not present

## 2024-06-04 LAB — POCT INFLUENZA A/B
Influenza A, POC: NEGATIVE
Influenza B, POC: NEGATIVE

## 2024-06-04 MED ORDER — FLUTICASONE PROPIONATE 50 MCG/ACT NA SUSP: 2.0000 | Freq: Every day | NASAL | 0 refills | Status: AC

## 2024-06-04 NOTE — Telephone Encounter (Signed)
 FYI Only or Action Required?: FYI only for provider.  Patient was last seen in primary care on 09/18/2023 by Sharma Coyer, MD.  Called Nurse Triage reporting Cough, wheezing, body aches, congestion  Symptoms began yesterday.  Interventions attempted: Rest, hydration, or home remedies.  Symptoms are: gradually worsening.  Triage Disposition: See HCP Within 4 Hours (Or PCP Triage)  Patient/caregiver understands and will follow disposition?: Yes     Copied from CRM #[id number]. Topic: Clinical - Red Word Triage >> Jun 04, 2024  8:11 AM Fonda T wrote: [facility] that prompted transfer to Nurse Triage: Patient calling, states he has a worsening cough, body aches all over, chest congestion, sore throat, colored mucous, nasal congestion, diarrhea Reason for Disposition  Wheezing is present  Answer Assessment - Initial Assessment Questions 1. ONSET: When did the cough begin?      All symptoms started up yesterday 2. SEVERITY: How bad is the cough today?      Patient states he is noticing he is coughing up phlegm 3. SPUTUM: Describe the color of your sputum (e.g., none, dry cough; clear, white, yellow, green)     Clear 4. HEMOPTYSIS: Are you coughing up any blood? If Yes, ask: How much? (e.g., flecks, streaks, tablespoons, etc.)     No 5. DIFFICULTY BREATHING: Are you having difficulty breathing? If Yes, ask: How bad is it? (e.g., mild, moderate, severe)      Moderate  6. FEVER: Do you have a fever? If Yes, ask: What is your temperature, how was it measured, and when did it start?     No 7. CARDIAC HISTORY: Do you have any history of heart disease? (e.g., heart attack, congestive heart failure)      No 8. LUNG HISTORY: Do you have any history of lung disease?  (e.g., pulmonary embolus, asthma, emphysema)     Asthma, OSA 9. PE RISK FACTORS: Do you have a history of blood clots? (or: recent major surgery, recent prolonged travel, bedridden)     No 10.  OTHER SYMPTOMS: Do you have any other symptoms? (e.g., runny nose, wheezing, chest pain)       Body aches, chest congestion, sore throat, colored mucous, noticeable wheezing  Protocols used: Cough - Acute Productive-A-AH

## 2024-06-04 NOTE — Progress Notes (Signed)
 Established patient visit  Patient: Timothy Mullins   DOB: 05-27-1976   48 y.o. Male  MRN: [id number] Visit Date: 06/04/2024  Today's healthcare provider: Jolynn Spencer, PA-C   Chief Complaint  Patient presents with   Cough    Congestion, diarrhea, cough onset 1 day no otc medication taken   Subjective     HPI     Cough    Additional comments: Congestion, diarrhea, cough onset 1 day no otc medication taken      Last edited by Timothy Hargis RAMAN, CMA on 06/04/2024  1:05 PM.       Discussed the use of AI scribe software for clinical note transcription with the patient, who gave verbal consent to proceed.  History of Present Illness Timothy Mullins is a 48 year old male with asthma who presents with cough and congestion.  He experiences cough and congestion with a runny nose and shortness of breath. No wheezing is present. He has a sore throat, which is mild. He uses Albuterol  and Symbicort  inhalers for asthma management. He is not currently taking medications for cough or congestion but is open to using over-the-counter options like Tylenol  and Advil. He does not smoke.     06/04/2024    1:07 PM  Depression screen PHQ 2/9  Decreased Interest 0  Down, Depressed, Hopeless 0  PHQ - 2 Score 0       No data to display          Medications: Outpatient Medications Prior to Visit  Medication Sig   albuterol  (VENTOLIN  HFA) 108 (90 Base) MCG/ACT inhaler Inhale 2 puffs into the lungs every 6 (six) hours as needed for wheezing or shortness of breath.   budesonide -formoterol  (SYMBICORT ) 80-4.5 MCG/ACT inhaler Inhale 2 puffs into the lungs 2 (two) times daily.   docusate sodium  (COLACE) 100 MG capsule Take 1 capsule (100 mg total) by mouth 2 (two) times daily.   fluticasone  (FLONASE ) 50 MCG/ACT nasal spray Place 2 sprays into both nostrils daily.   lamoTRIgine  (LAMICTAL ) 200 MG tablet Take 1 tablet (200 mg total) by mouth 2 (two) times daily.   pantoprazole  (PROTONIX ) 40 MG tablet  Take 1 tablet (40 mg total) by mouth 2 (two) times daily before a meal.   tamsulosin  (FLOMAX ) 0.4 MG CAPS capsule Take 1 capsule (0.4 mg total) by mouth daily.   topiramate  (TOPAMAX ) 50 MG tablet Take 1 tablet (50 mg total) by mouth 2 (two) times daily.   No facility-administered medications prior to visit.    Review of Systems All negative Except see HPI       Objective    BP (!) 141/86   Pulse (!) 106   Temp 99.3 F (37.4 C) (Oral)   Resp 16   Ht 5' 2 (1.575 m)   Wt 179 lb 4.8 oz (81.3 kg)   SpO2 98%   BMI 32.79 kg/m     Physical Exam Vitals reviewed.  Constitutional:      General: He is in acute distress.     Appearance: Normal appearance. He is not ill-appearing, toxic-appearing or diaphoretic.  HENT:     Head: Normocephalic and atraumatic.     Right Ear: Tympanic membrane, ear canal and external ear normal.     Left Ear: Tympanic membrane, ear canal and external ear normal.     Nose: Congestion and rhinorrhea present.     Mouth/Throat:     Pharynx: No posterior oropharyngeal erythema.  Eyes:     General:  No scleral icterus.       Right eye: No discharge.        Left eye: No discharge.     Extraocular Movements: Extraocular movements intact.     Conjunctiva/sclera: Conjunctivae normal.     Pupils: Pupils are equal, round, and reactive to light.  Cardiovascular:     Rate and Rhythm: Normal rate and regular rhythm.     Pulses: Normal pulses.     Heart sounds: Normal heart sounds. No murmur heard. Pulmonary:     Effort: Pulmonary effort is normal. No respiratory distress.     Breath sounds: Normal breath sounds. No wheezing or rhonchi.  Abdominal:     General: Abdomen is flat. Bowel sounds are normal.     Palpations: Abdomen is soft.  Musculoskeletal:        General: Normal range of motion.     Cervical back: Normal range of motion and neck supple.     Right lower leg: No edema.     Left lower leg: No edema.  Lymphadenopathy:     Cervical: No cervical  adenopathy.  Skin:    General: Skin is warm and dry.     Findings: No rash.  Neurological:     General: No focal deficit present.     Mental Status: He is alert and oriented to person, place, and time. Mental status is at baseline.  Psychiatric:        Behavior: Behavior normal.        Thought Content: Thought content normal.      No results found for any visits on 06/04/24.      Assessment & Plan Acute cough and could be COVID-19 infection Acute cough with congestion, diarrhea, and low-grade fever. Suspected COVID-19 infection. - Advise at-home COVID-19 test and report results. - Recommend acetaminophen  and ibuprofen for fever and inflammation. - Advise warm salt water gargles and hot tea with honey for sore throat. - Recommend nasal saline spray and intranasal corticosteroids for congestion. - Suggest antihistamines such as Allegra or Claritin. - Advise increased fluid intake. - Advise guaifenesin without decongestants due to hypertension.  Asthma Asthma with current shortness of breath. Uses albuterol  and a maintenance inhaler. - Continue albuterol  and Symbicort . - Prescribe intranasal corticosteroid if covered by insurance. - Advise continued use of albuterol  for cough, wheezing, and shortness of breath.  Hypertension Chronic and unstable Hypertension with slightly elevated blood pressure. Not on antihypertensive medication. - Advise to avoid guaifenesin with decongestants to prevent blood pressure elevation. Measure BP at home and bring his records to the next appointment  -continue low salt diet and regular exercise Will follow-up    Orders Placed This Encounter  Procedures   POC Covid19/Flu A&B Antigen    No follow-ups on file.   The patient was advised to call back or seek an in-person evaluation if the symptoms worsen or if the condition fails to improve as anticipated.  I discussed the assessment and treatment plan with the patient. The patient was  provided an opportunity to ask questions and all were answered. The patient agreed with the plan and demonstrated an understanding of the instructions.  I, Aalliyah Kilker, PA-C have reviewed all documentation for this visit. The documentation on 06/04/2024  for the exam, diagnosis, procedures, and orders are all accurate and complete.  Jolynn Spencer, [facility], MMS [facility] [phone] (phone) [phone] (fax)  [facility]

## 2024-06-26 ENCOUNTER — Encounter

## 2024-06-26 ENCOUNTER — Ambulatory Visit: Admitting: Internal Medicine

## 2024-07-07 ENCOUNTER — Emergency Department
Admission: EM | Admit: 2024-07-07 | Discharge: 2024-07-07 | Disposition: A | Discharge status: Home / Self Care | Attending: Emergency Medicine | Admitting: Emergency Medicine

## 2024-07-07 ENCOUNTER — Emergency Department

## 2024-07-07 DIAGNOSIS — J45909 Unspecified asthma, uncomplicated: Secondary | ICD-10-CM | POA: Diagnosis not present

## 2024-07-07 DIAGNOSIS — J40 Bronchitis, not specified as acute or chronic: Secondary | ICD-10-CM | POA: Insufficient documentation

## 2024-07-07 DIAGNOSIS — R059 Cough, unspecified: Secondary | ICD-10-CM | POA: Insufficient documentation

## 2024-07-07 DIAGNOSIS — I10 Essential (primary) hypertension: Secondary | ICD-10-CM | POA: Insufficient documentation

## 2024-07-07 DIAGNOSIS — R0602 Shortness of breath: Secondary | ICD-10-CM | POA: Diagnosis present

## 2024-07-07 LAB — RESP PANEL BY RT-PCR (RSV, FLU A&B, COVID)  RVPGX2
Influenza A by PCR: NEGATIVE
Influenza B by PCR: NEGATIVE
Resp Syncytial Virus by PCR: NEGATIVE
SARS Coronavirus 2 by RT PCR: NEGATIVE

## 2024-07-07 LAB — BASIC METABOLIC PANEL WITH GFR
Anion gap: 10 (ref 5–15)
BUN: 13 mg/dL (ref 6–20)
CO2: 20 mmol/L — ABNORMAL LOW (ref 22–32)
Calcium: 8.8 mg/dL — ABNORMAL LOW (ref 8.9–10.3)
Chloride: 107 mmol/L (ref 98–111)
Creatinine, Ser: 1.03 mg/dL (ref 0.61–1.24)
GFR, Estimated: >60 mL/min (ref >60)
Glucose, Bld: 122 mg/dL — ABNORMAL HIGH (ref 70–99)
Potassium: 3.5 mmol/L (ref 3.5–5.1)
Sodium: 137 mmol/L (ref 135–145)

## 2024-07-07 LAB — CBC
HCT: 46.4 % (ref 39.0–52.0)
Hemoglobin: 15.2 g/dL (ref 13.0–17.0)
MCH: 28.4 pg (ref 26.0–34.0)
MCHC: 32.8 g/dL (ref 30.0–36.0)
MCV: 86.6 fL (ref 80.0–100.0)
Platelets: 246 K/uL (ref 150–400)
RBC: 5.36 MIL/uL (ref 4.22–5.81)
RDW: 13.1 % (ref 11.5–15.5)
WBC: 8 K/uL (ref 4.0–10.5)
nRBC: 0 % (ref 0.0–0.2)

## 2024-07-07 LAB — TROPONIN I (HIGH SENSITIVITY): Troponin I (High Sensitivity): 3 ng/L (ref <18)

## 2024-07-07 MED ORDER — IPRATROPIUM-ALBUTEROL 0.5-2.5 (3) MG/3ML IN SOLN
3.0000 mL | Freq: Once | RESPIRATORY_TRACT | Status: AC
  Administered 2024-07-07: 3 mL via RESPIRATORY_TRACT
  Filled 2024-07-07: qty 3

## 2024-07-07 MED ORDER — GUAIFENESIN ER 600 MG PO TB12: 600.0000 mg | ORAL_TABLET | Freq: Two times a day (BID) | ORAL | 0 refills | Status: AC

## 2024-07-07 MED ORDER — ALBUTEROL SULFATE HFA 108 (90 BASE) MCG/ACT IN AERS: 2.0000 | INHALATION_SPRAY | Freq: Four times a day (QID) | RESPIRATORY_TRACT | 0 refills | Status: AC | PRN

## 2024-07-07 NOTE — ED Notes (Signed)
 Pt taken to xray and then coming to ED 2

## 2024-07-07 NOTE — ED Provider Notes (Signed)
 [facility] Provider Note    Event Date/Time   First MD Initiated Contact with Patient 07/07/24 1714     (approximate)   History   Chief Complaint Shortness of Breath   HPI  Lavaughn Haberle is a 48 y.o. male with past medical history of hypertension, seizures, spina bifida, and asthma who presents to the ED complaining of shortness of breath.  Patient reports that he woke up this morning with cough productive of clear and greenish sputum, has been feeling short of breath since then.  He also describes tightness and discomfort in his chest, but he denies any pain or swelling in his legs.  He has not had any fevers and denies any nausea, vomiting, or diarrhea.  He is not aware of any sick contacts.     Physical Exam   Triage Vital Signs: ED Triage Vitals  Encounter Vitals Group     BP 07/07/24 1648 125/87     Girls Systolic BP Percentile --      Girls Diastolic BP Percentile --      Boys Systolic BP Percentile --      Boys Diastolic BP Percentile --      Pulse Rate 07/07/24 1648 (!) 102     Resp 07/07/24 1648 (!) 24     Temp 07/07/24 1648 98.8 F (37.1 C)     Temp Source 07/07/24 1648 Oral     SpO2 07/07/24 1648 96 %     Weight 07/07/24 1655 175 lb (79.4 kg)     Height 07/07/24 1655 5' 2 (1.575 m)     Head Circumference --      Peak Flow --      Pain Score 07/07/24 1655 0     Pain Loc --      Pain Education --      Exclude from Growth Chart --     Most recent vital signs: Vitals:   07/07/24 1648 07/07/24 1730  BP: 125/87 132/85  Pulse: (!) 102 99  Resp: (!) 24   Temp: 98.8 F (37.1 C)   SpO2: 96% 97%    Constitutional: Alert and oriented. Eyes: Conjunctivae are normal. Head: Atraumatic. Nose: No congestion/rhinnorhea. Mouth/Throat: Mucous membranes are moist.  Cardiovascular: Normal rate, regular rhythm. Grossly normal heart sounds.  2+ radial pulses bilaterally. Respiratory: Normal respiratory effort.  No retractions. Lungs  CTAB. Gastrointestinal: Soft and nontender. No distention. Musculoskeletal: No lower extremity tenderness nor edema.  Neurologic:  Normal speech and language. No gross focal neurologic deficits are appreciated.    ED Results / Procedures / Treatments   Labs (all labs ordered are listed, but only abnormal results are displayed) Labs Reviewed  BASIC METABOLIC PANEL WITH GFR - Abnormal; Notable for the following components:      Result Value   CO2 20 (*)    Glucose, Bld 122 (*)    Calcium 8.8 (*)    All other components within normal limits  RESP PANEL BY RT-PCR (RSV, FLU A&B, COVID)  RVPGX2  CBC  TROPONIN I (HIGH SENSITIVITY)     EKG  ED ECG REPORT I, Carlin Palin, the attending physician, personally viewed and interpreted this ECG.   Date: 07/07/2024  EKG Time: 16:52  Rate: 105  Rhythm: sinus tachycardia  Axis: Normal  Intervals:none  ST&T Change: None  RADIOLOGY Chest x-ray reviewed and interpreted by me with no infiltrate, edema, or effusion.  PROCEDURES:  Critical Care performed: No  Procedures   MEDICATIONS ORDERED  IN ED: Medications  ipratropium-albuterol  (DUONEB) 0.5-2.5 (3) MG/3ML nebulizer solution 3 mL (3 mLs Nebulization Given 07/07/24 1741)     IMPRESSION / MDM / ASSESSMENT AND PLAN / ED COURSE  I reviewed the triage vital signs and the nursing notes.                              48 y.o. male with past medical history of hypertension, asthma, spina bifida, and seizures who presents to the ED complaining of productive cough and shortness of breath since waking up this morning.  Patient's presentation is most consistent with acute presentation with potential threat to life or bodily function.  Differential diagnosis includes, but is not limited to, pneumonia, bronchitis, asthma exacerbation, COVID-19, influenza, ACS, PE.  Patient nontoxic-appearing and in no acute distress, vital signs remarkable for mild tachypnea but otherwise reassuring.   At the time my assessment, he is not in any respiratory distress and is maintaining oxygen saturations at 97% on room air.  I do not appreciate significant wheezing but we will trial DuoNeb.  EKG without evidence of arrhythmia or ischemia and symptoms seem atypical for ACS or PE, troponin pending.  Chest x-ray and additional labs are also pending, including testing for COVID and flu.  Chest x-ray is unremarkable, COVID and flu testing is negative.  Labs are also reassuring without significant anemia, leukocytosis, electrolyte abnormality, or AKI.  Troponin within normal limits and patient appropriate for discharge home with symptomatic treatment for bronchitis.  He was counseled to return to the ED for new or worsening symptoms, patient agrees with plan.      FINAL CLINICAL IMPRESSION(S) / ED DIAGNOSES   Final diagnoses:  Bronchitis     Rx / DC Orders   ED Discharge Orders          Ordered    albuterol  (VENTOLIN  HFA) 108 (90 Base) MCG/ACT inhaler  Every 6 hours PRN       Note to Pharmacy: Please supply with spacer   07/07/24 1822    guaiFENesin (MUCINEX) 600 MG 12 hr tablet  2 times daily        07/07/24 1822             Note:  This document was prepared using Dragon voice recognition software and may include unintentional dictation errors.   Timothy Dunnings, MD 07/07/24 TRENNA

## 2024-07-07 NOTE — ED Triage Notes (Signed)
 Pt presents to the ED via POV from home with productive cough (green), chest tightness, and SHOB since this morning. Pt reports nasal congestion started today as well. Pt A&Ox4.

## 2024-07-17 ENCOUNTER — Ambulatory Visit: Payer: Self-pay

## 2024-07-17 NOTE — Telephone Encounter (Addendum)
 Pt's wife states that he was in the ER for bronchitis and all they gave him was mucinex and albuterol  inhaler and she does not believe they are working for him. RN scheduled for an appt and gave strict instructions on when to return to ER.    Reason for Disposition  [1] Longstanding difficulty breathing (e.g., CHF, COPD, emphysema) AND [2] WORSE than normal  Answer Assessment - Initial Assessment Questions Went to the Er they gave mucinex and albuterol  inhaler    1. RESPIRATORY STATUS: Describe your breathing? (e.g., wheezing, shortness of breath, unable to speak, severe coughing)      Shortness of breath, coughing, wheezing 2. ONSET: When did this breathing problem begin?      07/05/24 3. PATTERN Does the difficult breathing come and go, or has it been constant since it started?      constant 4. SEVERITY: How bad is your breathing? (e.g., mild, moderate, severe)      9/10 5. RECURRENT SYMPTOM: Have you had difficulty breathing before? If Yes, ask: When was the last time? and What happened that time?      yes  7. LUNG HISTORY: Do you have any history of lung disease?  (e.g., pulmonary embolus, asthma, emphysema)     asthma  9. OTHER SYMPTOMS: Do you have any other symptoms? (e.g., chest pain, cough, dizziness, fever, runny nose)     Green mucus 10. O2 SATURATION MONITOR:  Do you use an oxygen saturation monitor (pulse oximeter) at home? If Yes, ask: What is your reading (oxygen level) today? What is your usual oxygen saturation reading? (e.g., 95%)       Husband is not there currently  Protocols used: Breathing Difficulty-A-AH

## 2024-07-17 NOTE — Telephone Encounter (Signed)
 FYI Only or Action Required?: FYI only for provider: appointment scheduled on 11.7.25.  Patient was last seen in primary care on 06/04/2024 by Ostwalt, Janna, PA-C.  Called Nurse Triage reporting Shortness of Breath.  Symptoms began several weeks ago.  Interventions attempted: Prescription medications: mucinex and albuterol  inhaler.  Symptoms are: unchanged.  Triage Disposition: See HCP Within 4 Hours (Or PCP Triage)  Patient/caregiver understands and will follow disposition?: Yes

## 2024-07-18 ENCOUNTER — Inpatient Hospital Stay: Admitting: Family Medicine

## 2024-08-20 ENCOUNTER — Ambulatory Visit: Admitting: Internal Medicine

## 2024-08-20 ENCOUNTER — Encounter

## 2024-09-15 ENCOUNTER — Telehealth: Payer: Self-pay | Admitting: Family Medicine

## 2024-09-15 NOTE — Telephone Encounter (Signed)
 Copied from CRM [id number]. Topic: Referral - Question >> Sep 15, 2024 12:05 PM Willma R wrote: Reason for CRM: Patient is looking to see if Dr Marcine Essex can help refer him to a Dentist in [place] if possible who accepts his insurance, [employer] [place].   Patient can be reached at [phone]

## 2024-09-16 NOTE — Telephone Encounter (Signed)
 If patient unable to access Mychart message, please share recommendation to check with insurance company for dentists in the area. I am not able to refer to dentists given difference in insurance coverage

## 2024-09-17 NOTE — Telephone Encounter (Signed)
 Spoke with patient, have advised of providers message, patient understood and will retry insurance company.

## 2024-10-03 ENCOUNTER — Other Ambulatory Visit: Payer: Self-pay | Admitting: Family Medicine

## 2024-10-03 DIAGNOSIS — G40909 Epilepsy, unspecified, not intractable, without status epilepticus: Secondary | ICD-10-CM

## 2024-10-06 NOTE — Telephone Encounter (Signed)
 Requested medication (s) are due for refill today: yes  Requested medication (s) are on the active medication list: yes  Last refill:  09/28/23  Future visit scheduled: yes  Notes to clinic:  Unable to refill per protocol due to failed labs, no updated results.      Requested Prescriptions  Pending Prescriptions Disp Refills   topiramate  (TOPAMAX ) 50 MG tablet [Pharmacy Med Name: TOPIRAMATE  50 MG TAB] 180 tablet 3    Sig: TAKE 1 TABLET BY MOUTH TWICE DAILY     Neurology: Anticonvulsants - topiramate  & zonisamide Failed - 10/06/2024  8:48 AM      Failed - CO2 in normal range and within 360 days    CO2  Date Value Ref Range Status  07/07/2024 20 (L) 22 - 32 mmol/L Final         Failed - ALT in normal range and within 360 days    ALT  Date Value Ref Range Status  05/19/2019 18 0 - 44 U/L Final         Failed - AST in normal range and within 360 days    AST  Date Value Ref Range Status  05/19/2019 16 15 - 41 U/L Final         Failed - Valid encounter within last 12 months    Recent Outpatient Visits           4 months ago Cough in adult   [facility] [place], Maynard, PA-C              Passed - Cr in normal range and within 360 days    Creatinine, Ser  Date Value Ref Range Status  07/07/2024 1.03 0.61 - 1.24 mg/dL Final         Passed - Completed PHQ-2 or PHQ-9 in the last 360 days

## 2024-10-07 ENCOUNTER — Other Ambulatory Visit: Payer: Self-pay

## 2024-10-07 DIAGNOSIS — R0602 Shortness of breath: Secondary | ICD-10-CM

## 2024-10-08 ENCOUNTER — Encounter

## 2024-10-08 ENCOUNTER — Ambulatory Visit: Admitting: Internal Medicine

## 2024-10-09 ENCOUNTER — Encounter

## 2024-10-09 ENCOUNTER — Ambulatory Visit: Admitting: Internal Medicine

## 2024-10-13 ENCOUNTER — Other Ambulatory Visit: Payer: Self-pay | Admitting: Family Medicine

## 2024-10-13 DIAGNOSIS — G40909 Epilepsy, unspecified, not intractable, without status epilepticus: Secondary | ICD-10-CM

## 2024-11-12 ENCOUNTER — Ambulatory Visit: Admitting: Family Medicine

## 2024-12-24 ENCOUNTER — Encounter

## 2024-12-24 ENCOUNTER — Ambulatory Visit: Admitting: Internal Medicine
